# Patient Record
Sex: Female | Born: 1960 | Hispanic: No | Marital: Married | Smoking: Never smoker
Health system: Southern US, Community
[De-identification: ages and names within clinical notes are randomized; demographics above are authoritative.]

## PROBLEM LIST (undated history)

## (undated) DIAGNOSIS — K2970 Gastritis, unspecified, without bleeding: Secondary | ICD-10-CM

## (undated) DIAGNOSIS — Z85850 Personal history of malignant neoplasm of thyroid: Secondary | ICD-10-CM

## (undated) DIAGNOSIS — E785 Hyperlipidemia, unspecified: Secondary | ICD-10-CM

## (undated) DIAGNOSIS — T7840XA Allergy, unspecified, initial encounter: Secondary | ICD-10-CM

## (undated) DIAGNOSIS — E559 Vitamin D deficiency, unspecified: Secondary | ICD-10-CM

## (undated) DIAGNOSIS — C73 Malignant neoplasm of thyroid gland: Secondary | ICD-10-CM

## (undated) DIAGNOSIS — R109 Unspecified abdominal pain: Secondary | ICD-10-CM

## (undated) DIAGNOSIS — I639 Cerebral infarction, unspecified: Secondary | ICD-10-CM

## (undated) DIAGNOSIS — Z889 Allergy status to unspecified drugs, medicaments and biological substances status: Secondary | ICD-10-CM

## (undated) DIAGNOSIS — D352 Benign neoplasm of pituitary gland: Secondary | ICD-10-CM

## (undated) DIAGNOSIS — E039 Hypothyroidism, unspecified: Secondary | ICD-10-CM

## (undated) DIAGNOSIS — K760 Fatty (change of) liver, not elsewhere classified: Secondary | ICD-10-CM

## (undated) DIAGNOSIS — E041 Nontoxic single thyroid nodule: Secondary | ICD-10-CM

## (undated) DIAGNOSIS — K859 Acute pancreatitis without necrosis or infection, unspecified: Secondary | ICD-10-CM

## (undated) DIAGNOSIS — R7301 Impaired fasting glucose: Secondary | ICD-10-CM

## (undated) DIAGNOSIS — R002 Palpitations: Secondary | ICD-10-CM

## (undated) DIAGNOSIS — E079 Disorder of thyroid, unspecified: Secondary | ICD-10-CM

## (undated) DIAGNOSIS — E669 Obesity, unspecified: Secondary | ICD-10-CM

## (undated) DIAGNOSIS — Z6830 Body mass index (BMI) 30.0-30.9, adult: Secondary | ICD-10-CM

## (undated) DIAGNOSIS — Z789 Other specified health status: Secondary | ICD-10-CM

## (undated) DIAGNOSIS — K219 Gastro-esophageal reflux disease without esophagitis: Secondary | ICD-10-CM

## (undated) HISTORY — DX: Gastritis, unspecified, without bleeding: K29.70

## (undated) HISTORY — DX: Disorder of thyroid, unspecified: E07.9

## (undated) HISTORY — DX: Impaired fasting glucose: R73.01

## (undated) HISTORY — DX: Benign neoplasm of pituitary gland: D35.2

## (undated) HISTORY — DX: Obesity, unspecified: E66.9

## (undated) HISTORY — DX: Allergy, unspecified, initial encounter: T78.40XA

## (undated) HISTORY — PX: UPPER GASTROINTESTINAL ENDOSCOPY: SHX188

## (undated) HISTORY — PX: TRANSPHENOIDAL PITUITARY RESECTION: SHX2572

## (undated) HISTORY — DX: Fatty (change of) liver, not elsewhere classified: K76.0

## (undated) HISTORY — PX: COLONOSCOPY: SHX174

## (undated) HISTORY — PX: ESOPHAGOGASTRODUODENOSCOPY: SHX1529

## (undated) HISTORY — DX: Cerebral infarction, unspecified: I63.9

## (undated) HISTORY — DX: Acute pancreatitis without necrosis or infection, unspecified: K85.90

## (undated) HISTORY — PX: HAMMER TOE SURGERY: SHX385

## (undated) HISTORY — PX: POLYPECTOMY: SHX149

## (undated) HISTORY — DX: Unspecified abdominal pain: R10.9

## (undated) HISTORY — DX: Body mass index (BMI) 30.0-30.9, adult: Z68.30

## (undated) HISTORY — DX: Hyperlipidemia, unspecified: E78.5

## (undated) HISTORY — DX: Personal history of malignant neoplasm of thyroid: Z85.850

## (undated) HISTORY — DX: Palpitations: R00.2

## (undated) HISTORY — DX: Vitamin D deficiency, unspecified: E55.9

## (undated) HISTORY — DX: Nontoxic single thyroid nodule: E04.1

## (undated) HISTORY — DX: Malignant neoplasm of thyroid gland: C73

---

## 1984-10-20 HISTORY — PX: APPENDECTOMY: SHX54

## 2007-10-21 HISTORY — PX: THYROIDECTOMY: SHX17

## 2008-08-14 ENCOUNTER — Encounter: Payer: Self-pay | Admitting: Endocrinology

## 2008-08-21 ENCOUNTER — Encounter: Payer: Self-pay | Admitting: Endocrinology

## 2008-08-22 ENCOUNTER — Encounter: Payer: Self-pay | Admitting: Endocrinology

## 2008-09-21 ENCOUNTER — Encounter: Payer: Self-pay | Admitting: Endocrinology

## 2008-09-25 ENCOUNTER — Encounter: Payer: Self-pay | Admitting: Endocrinology

## 2008-12-19 ENCOUNTER — Encounter: Payer: Self-pay | Admitting: Endocrinology

## 2009-01-17 DIAGNOSIS — E785 Hyperlipidemia, unspecified: Secondary | ICD-10-CM | POA: Insufficient documentation

## 2009-01-31 ENCOUNTER — Ambulatory Visit: Payer: Self-pay | Admitting: Endocrinology

## 2009-01-31 DIAGNOSIS — K219 Gastro-esophageal reflux disease without esophagitis: Secondary | ICD-10-CM | POA: Insufficient documentation

## 2009-01-31 DIAGNOSIS — C73 Malignant neoplasm of thyroid gland: Secondary | ICD-10-CM | POA: Insufficient documentation

## 2009-01-31 DIAGNOSIS — E890 Postprocedural hypothyroidism: Secondary | ICD-10-CM | POA: Insufficient documentation

## 2009-12-21 ENCOUNTER — Encounter: Admission: RE | Admit: 2009-12-21 | Discharge: 2009-12-21 | Payer: Self-pay | Admitting: Gynecology

## 2009-12-21 IMAGING — MG MM DIGITAL DIAGNOSTIC BILAT
4 series · 4 of 4 positions shown · non-contrast
Comparison: Previous bilateral screening mammogram dated
[DATE] and left breast ultrasound obtained at [REDACTED] on [DATE].

CLINICAL DATA: Follow-up left breast cyst.  Intermittent pain
across the inferior aspect of the left breast, not currently.

DIGITAL DIAGNOSTIC BILATERAL MAMMOGRAM WITH CAD

[R CC]
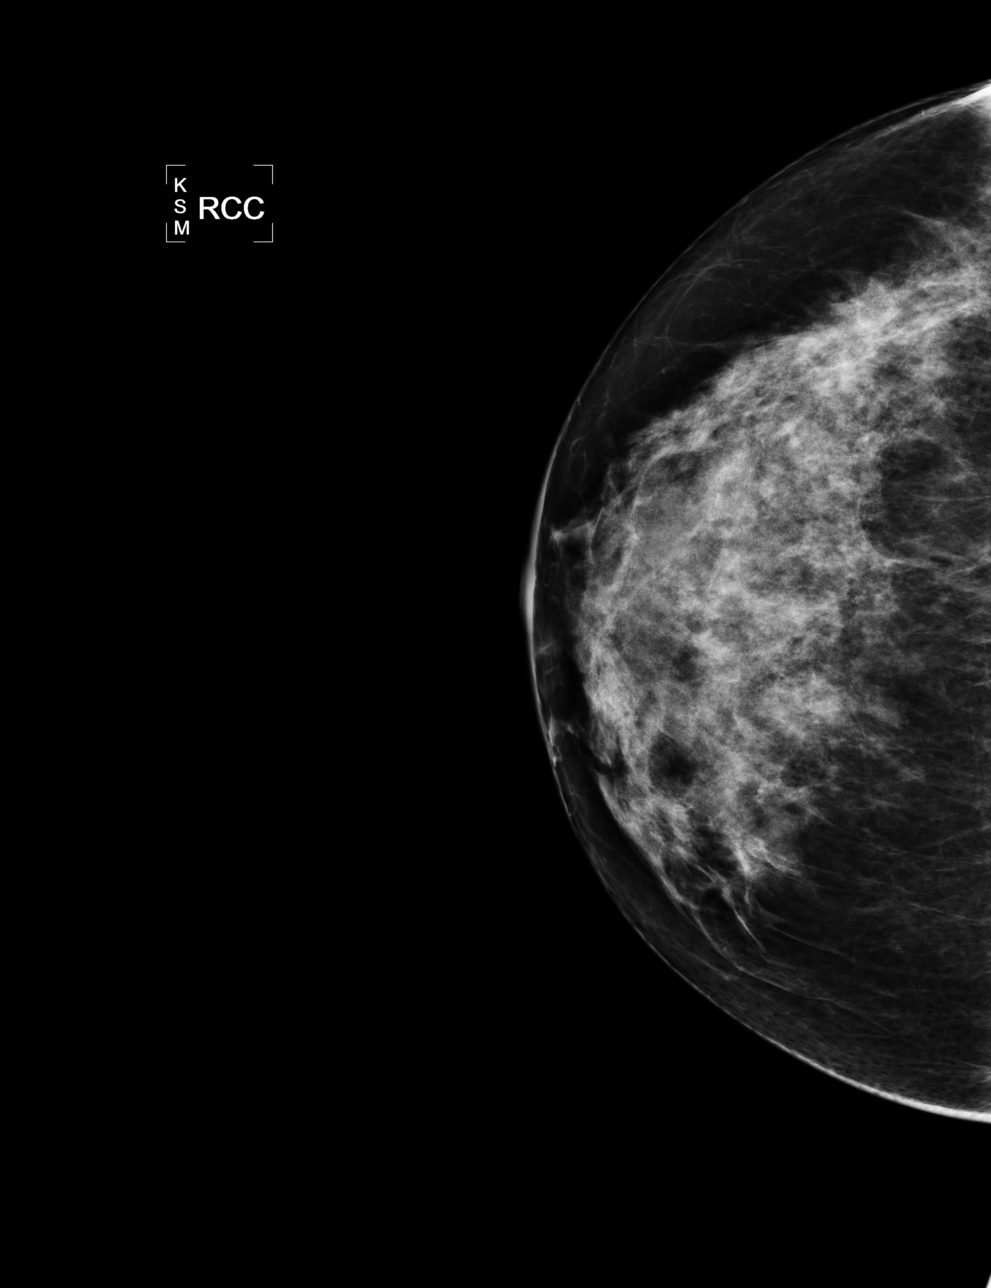

[L CC]
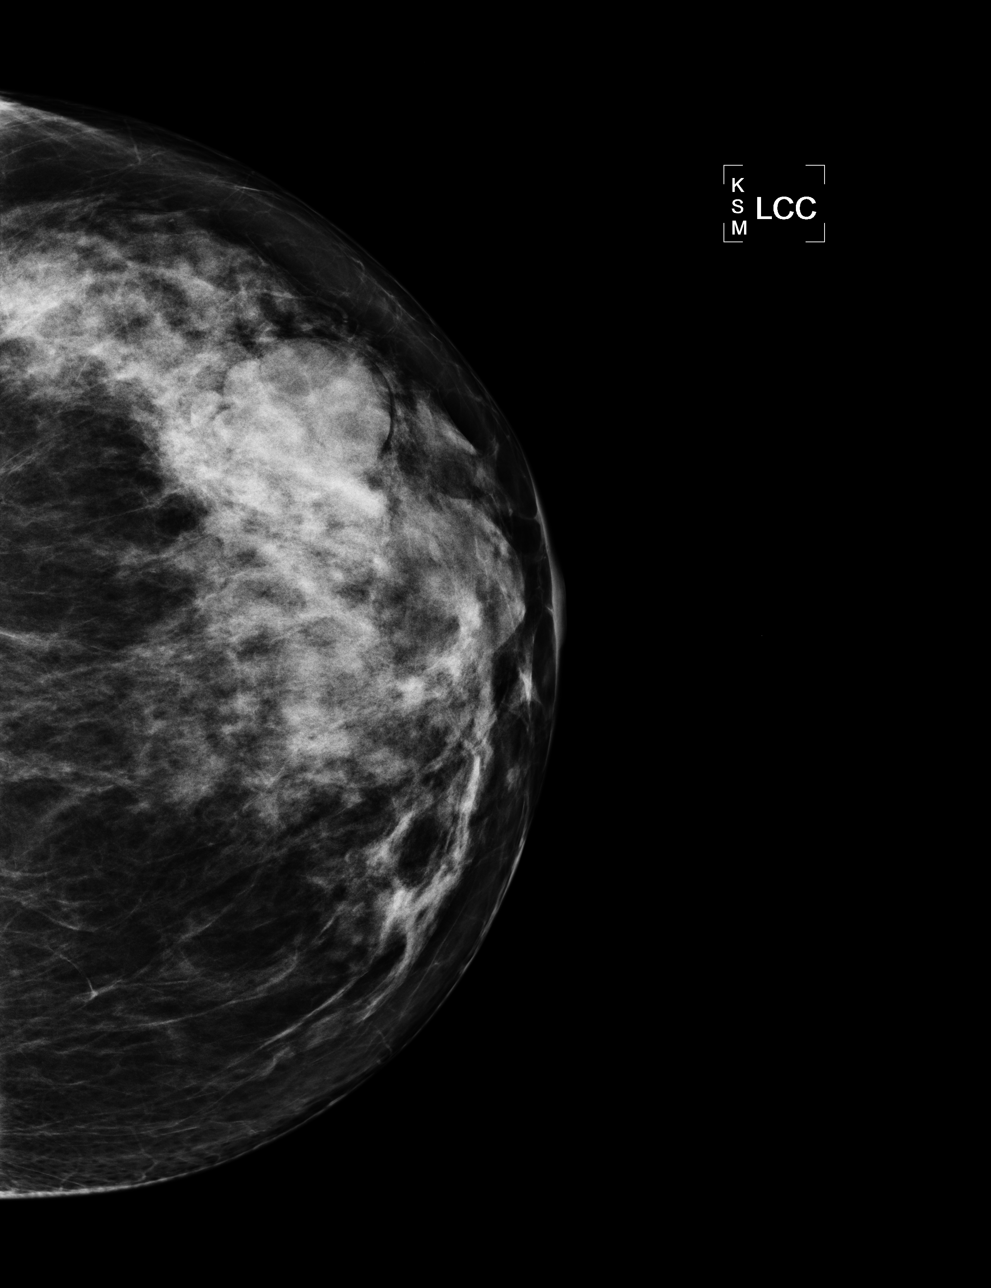

[L MLO]
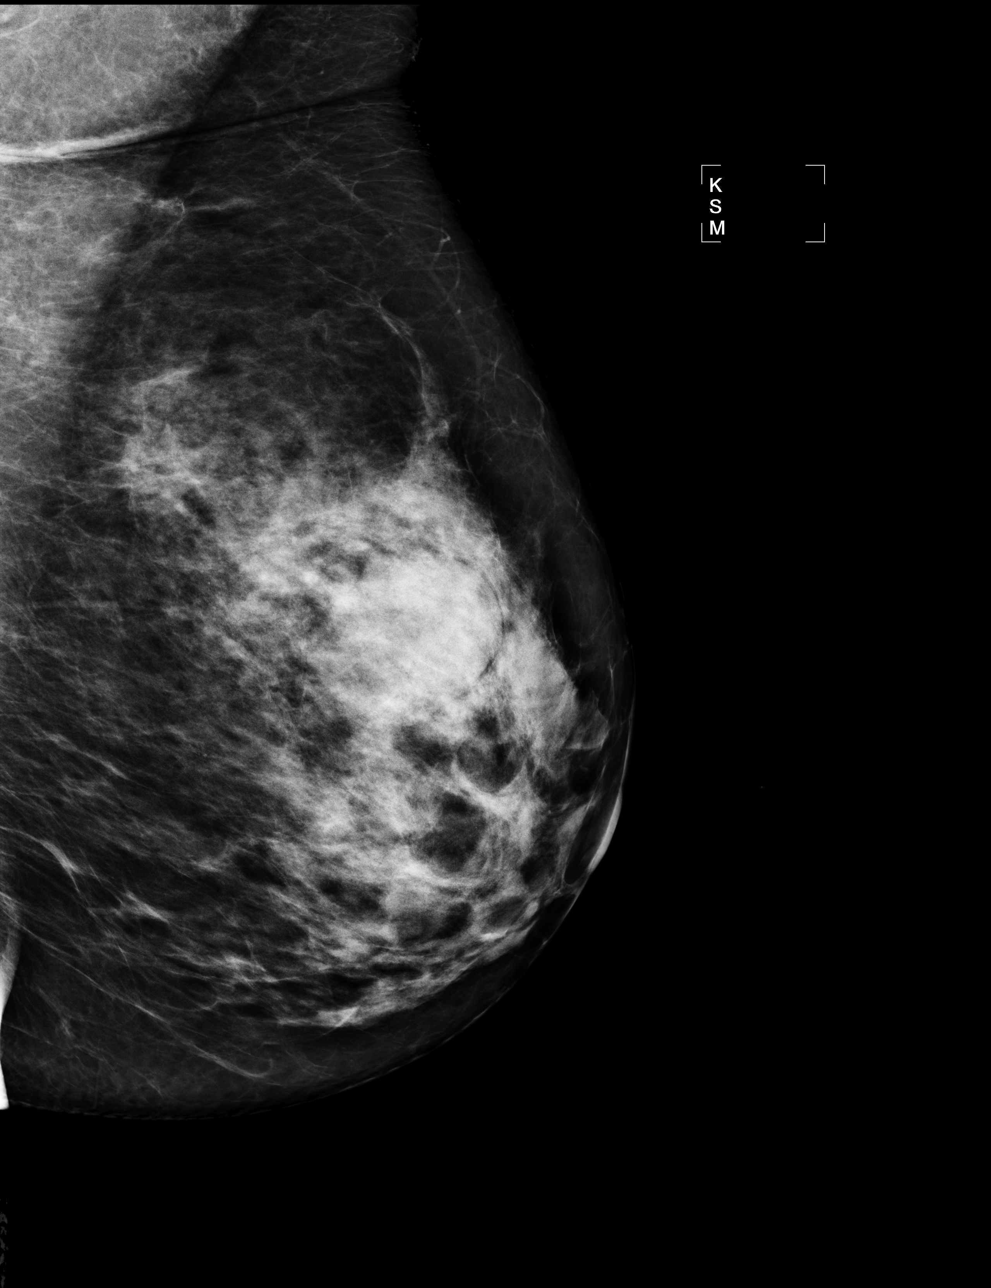

[R MLO]
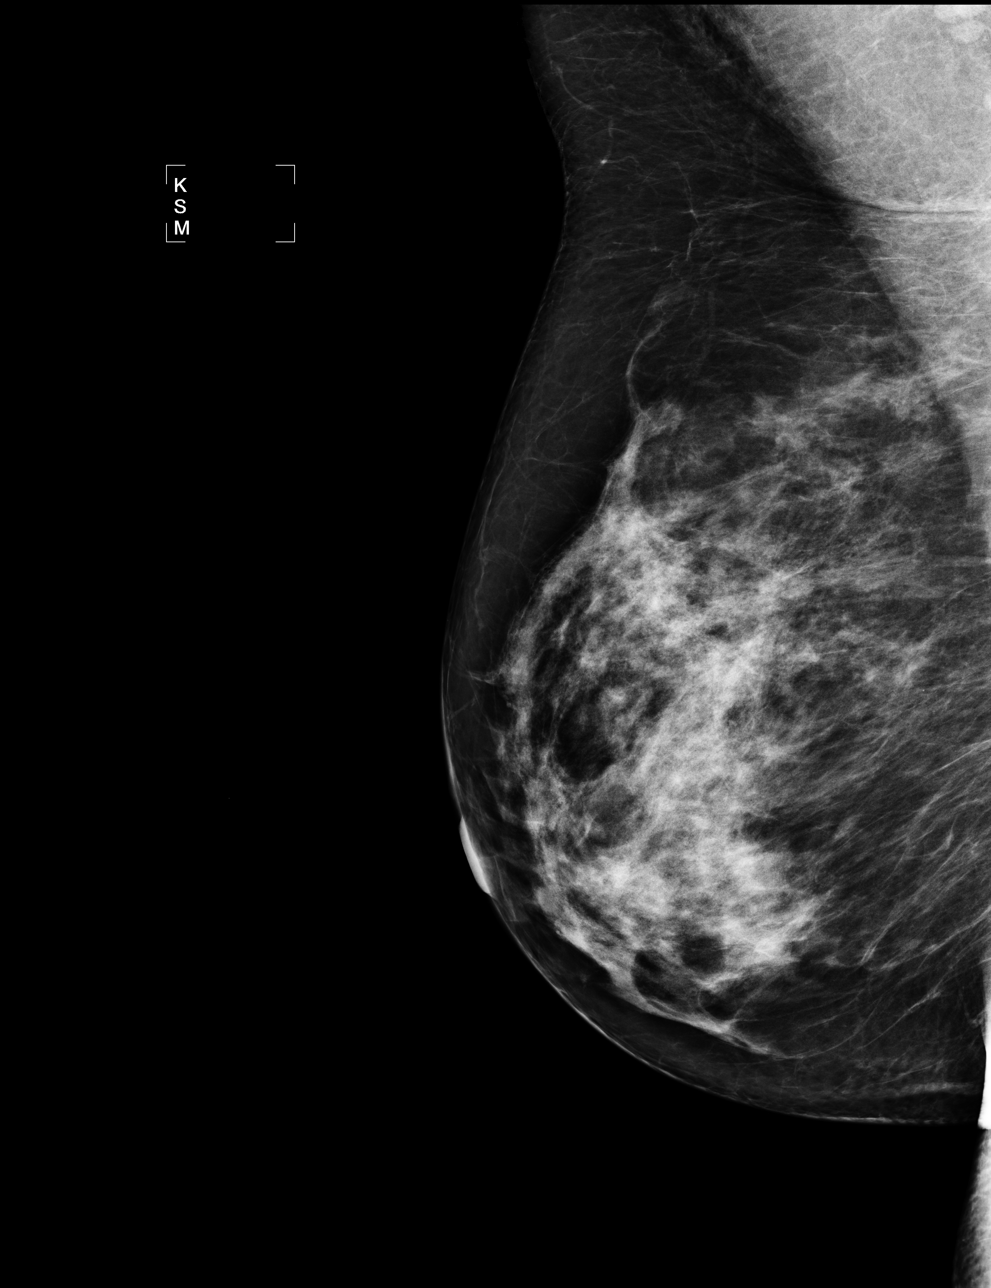

[4 of 4 positions shown; findings below may reference images not displayed]

FINDINGS: Stable heterogeneously dense parenchyma in both breasts.
The previously demonstrated 2.9 cm upper outer left breast cyst is
slightly larger, currently measuring 3.1 cm in maximum diameter.
No new findings suspicious for malignancy in either breast.
Mammographic images were processed with CAD.
IMPRESSION: Mild interval increase in size of the previously evaluated left
breast cyst.  No evidence of malignancy.  Screening mammography is
recommended in 1 year.

BI-RADS CATEGORY 2:  Benign finding(s).

## 2011-03-25 ENCOUNTER — Other Ambulatory Visit: Payer: Self-pay | Admitting: Gynecology

## 2011-03-25 DIAGNOSIS — N6310 Unspecified lump in the right breast, unspecified quadrant: Secondary | ICD-10-CM

## 2011-03-25 DIAGNOSIS — N6320 Unspecified lump in the left breast, unspecified quadrant: Secondary | ICD-10-CM

## 2011-03-31 ENCOUNTER — Ambulatory Visit
Admission: RE | Admit: 2011-03-31 | Discharge: 2011-03-31 | Disposition: A | Discharge status: Home / Self Care | Payer: BC Managed Care – PPO | Source: Ambulatory Visit | Attending: Gynecology | Admitting: Gynecology

## 2011-03-31 ENCOUNTER — Other Ambulatory Visit: Payer: Self-pay | Admitting: Gynecology

## 2011-03-31 DIAGNOSIS — N6310 Unspecified lump in the right breast, unspecified quadrant: Secondary | ICD-10-CM

## 2011-03-31 DIAGNOSIS — N6320 Unspecified lump in the left breast, unspecified quadrant: Secondary | ICD-10-CM

## 2011-03-31 IMAGING — US US BREAST R
1 series · 7 of 7 positions shown · non-contrast
Comparison: [DATE] and [DATE].

CLINICAL DATA: The patient complains of episodic bilateral breast
pain.  She does have a history of fibrocystic breast disease with
previous left breast cyst aspiration performed in [REDACTED] (the
patient's home).

DIGITAL DIAGNOSTIC BILATERAL MAMMOGRAM WITH CAD AND RIGHT BREAST
ULTRASOUND:

[Series 1: us breast right · 7 of 7 slices shown]
[im 1/7]
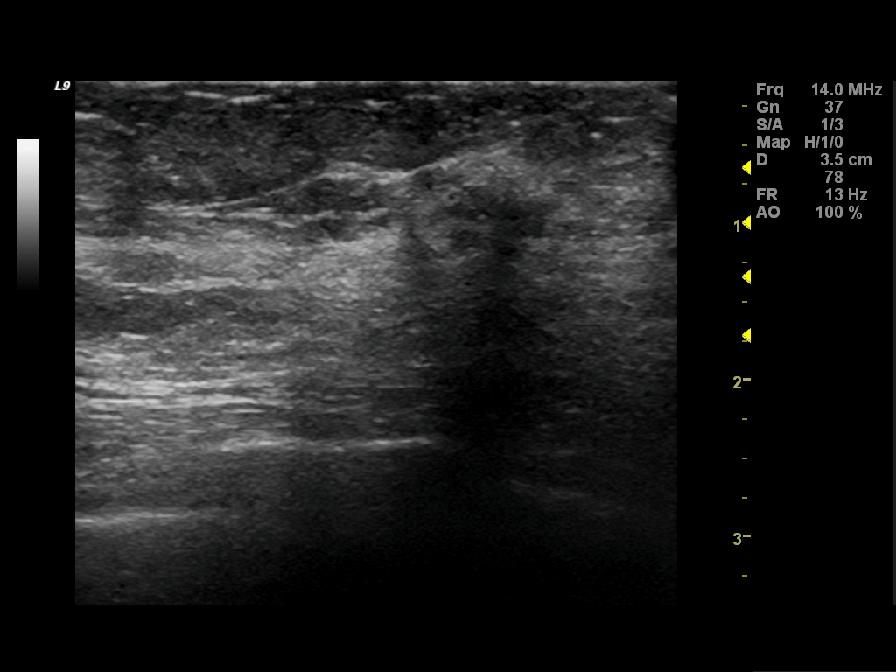
[im 2/7]
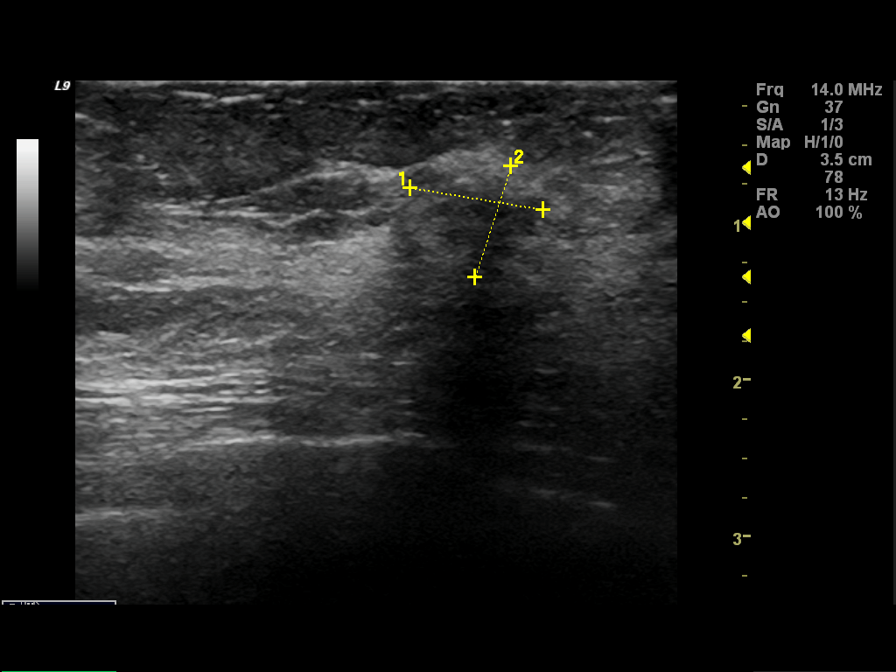
[im 3/7]
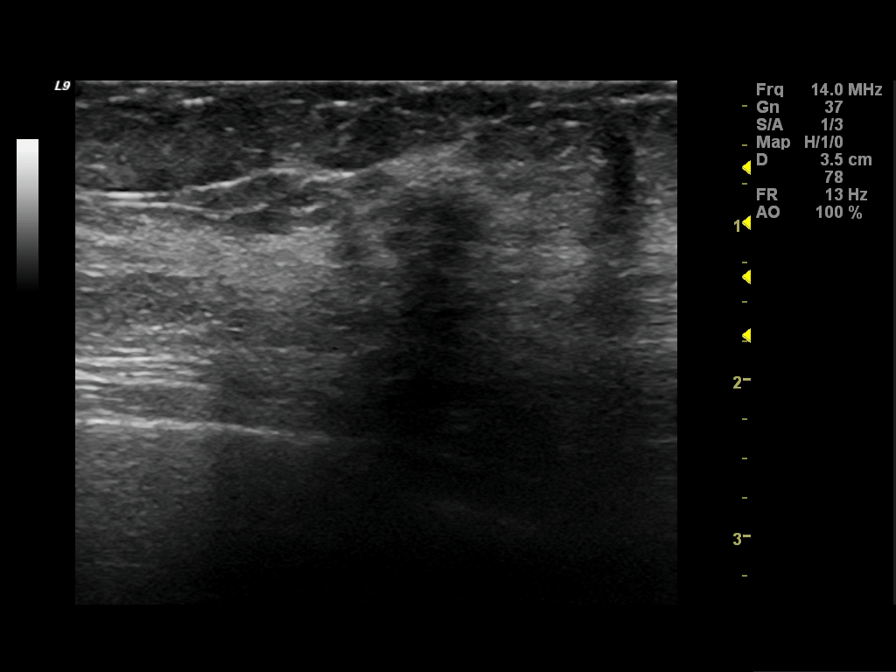
[im 4/7]
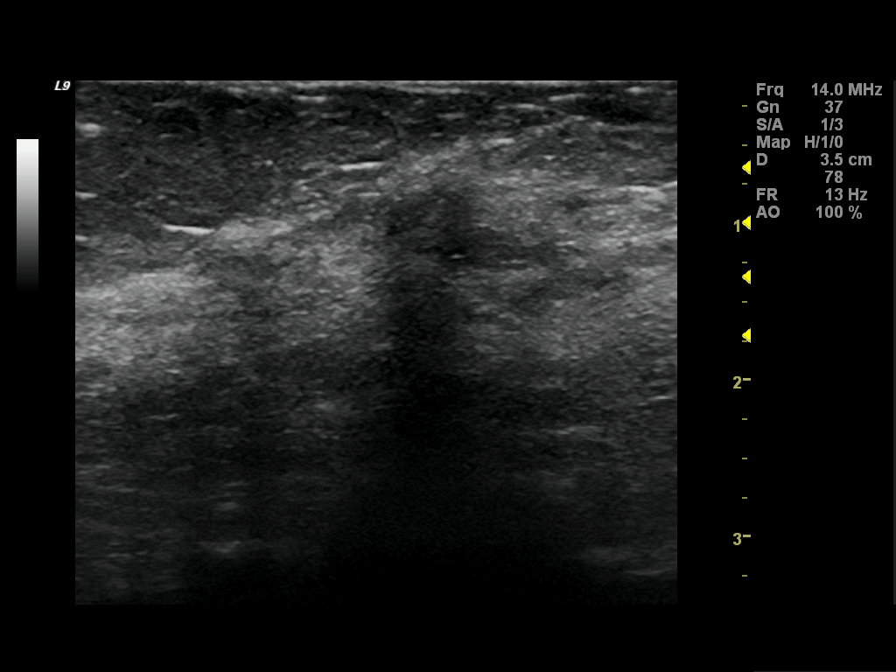
[im 5/7]
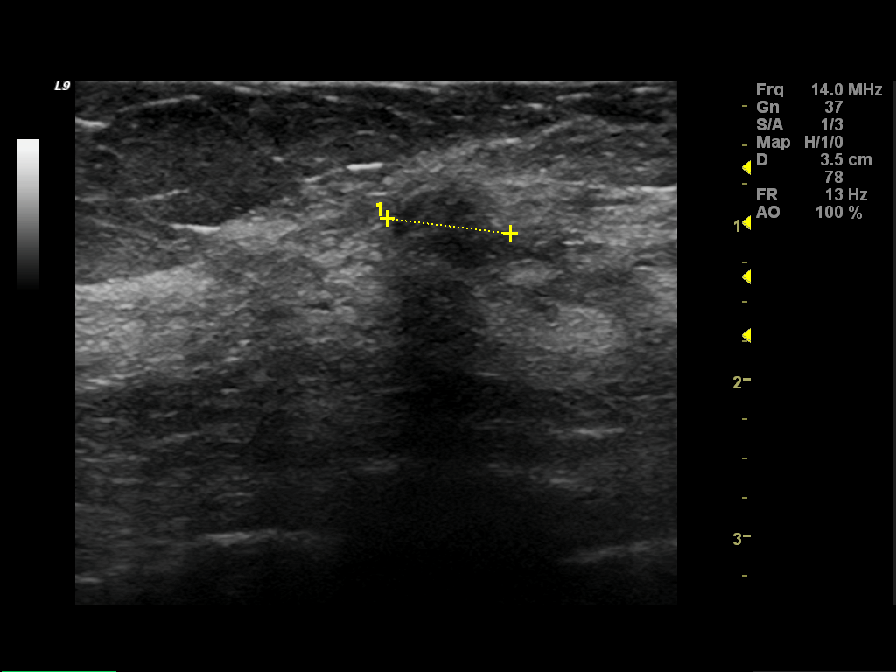
[im 6/7]
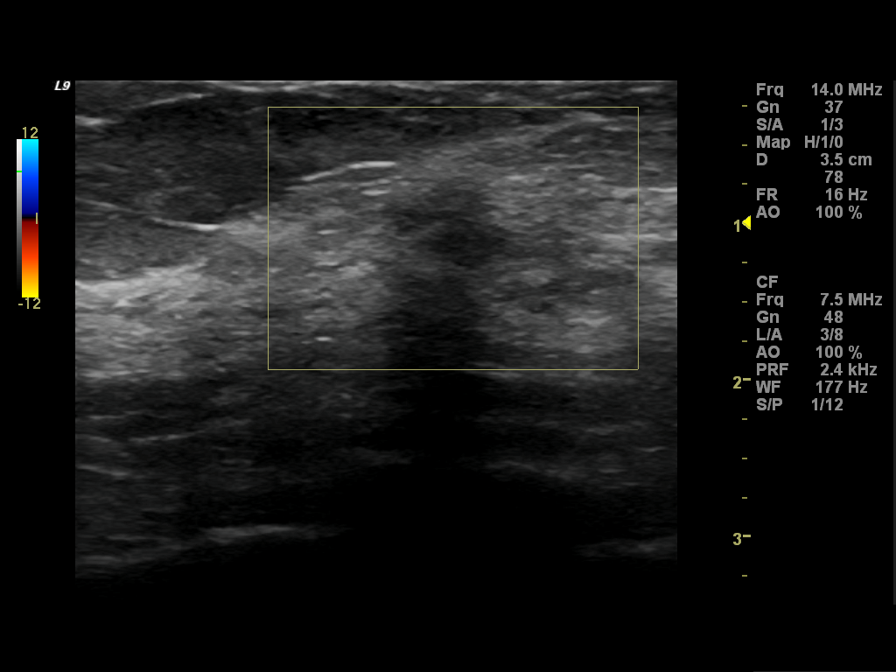
[im 7/7]
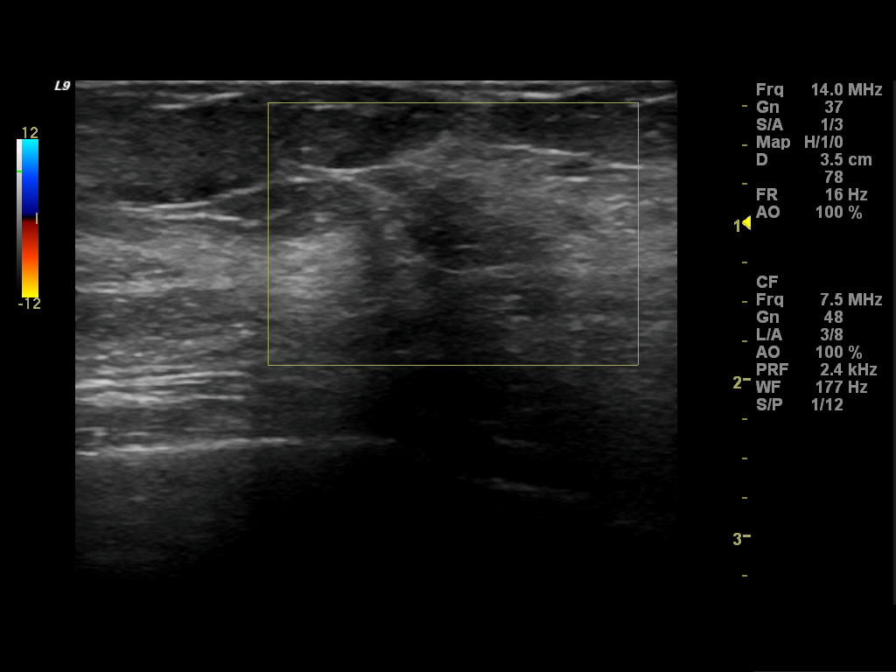

[7 of 7 positions shown; findings below may reference images not displayed]

FINDINGS: There is a heterogeneously dense breast parenchymal
pattern.  There is an area of subtle distortion located laterally
within the right breast at approximately the 9-10 o'clock position.
The previously seen cyst located laterally within the left breast
has resolved.  There are no worrisome calcifications.
Mammographic images were processed with CAD.

On physical exam, there is no discrete palpable abnormality within
the lateral right breast.

Ultrasound is performed, showing a subtle irregular hypoechoic area
with shadowing located within the right breast at the 9 o'clock
position approximately 6-7 cm from the nipple.  This is in the
region of the subtle distortion noted on mammography.  This is
difficult to measure with certainty by ultrasound but measures
approximately 9 mm in diameter.

I have discussed tissue sampling via ultrasound-guided core biopsy
or possibly stereotactic core biopsy if this is not adequately
visualized at the time the ultrasound-guided procedure.  Also, she
understands that clip placement is necessary and if this ultrasound
finding does not correspond to the mammographic finding,
stereotactic core biopsy or surgical excisional biopsy may be
needed.
IMPRESSION: Subtle area of distortion located laterally within the right
breast.  Tissue sampling is recommended.  Ultrasound-guided core
biopsy will be scheduled.

BI-RADS CATEGORY 4:  Suspicious abnormality - biopsy should be
considered.

## 2011-04-01 IMAGING — MG MM DIGITAL DIAGNOSTIC BILAT {BCG}
7 series · 7 of 7 positions shown · non-contrast
Comparison: [DATE] and [DATE].

CLINICAL DATA: The patient complains of episodic bilateral breast
pain.  She does have a history of fibrocystic breast disease with
previous left breast cyst aspiration performed in [REDACTED] (the
patient's home).

DIGITAL DIAGNOSTIC BILATERAL MAMMOGRAM WITH CAD AND RIGHT BREAST
ULTRASOUND:

[R CC (1 of 2)]
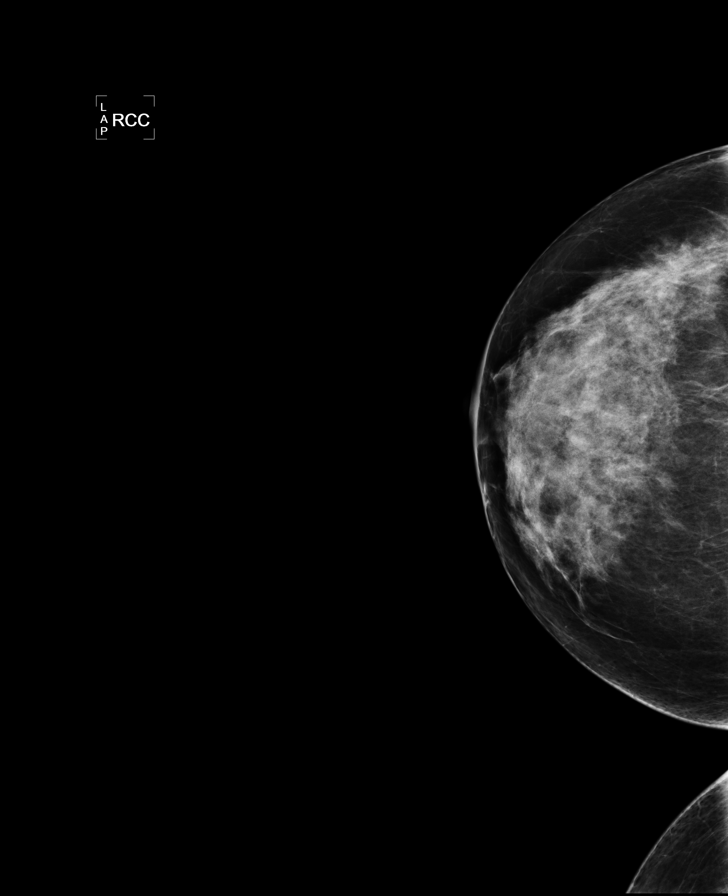

[L CC]
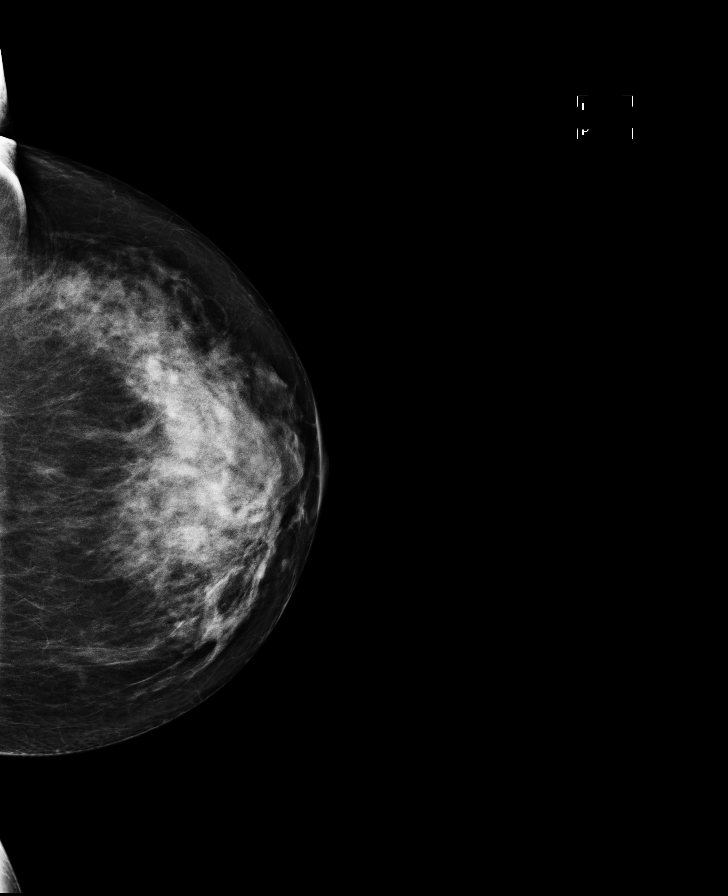

[L MLO]
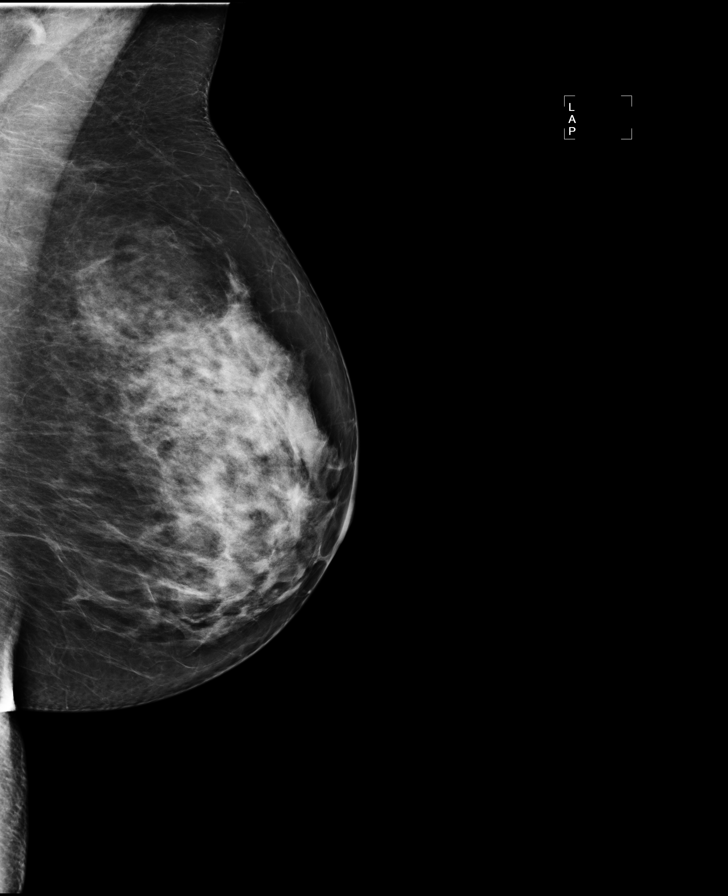

[R MLO (1 of 2)]
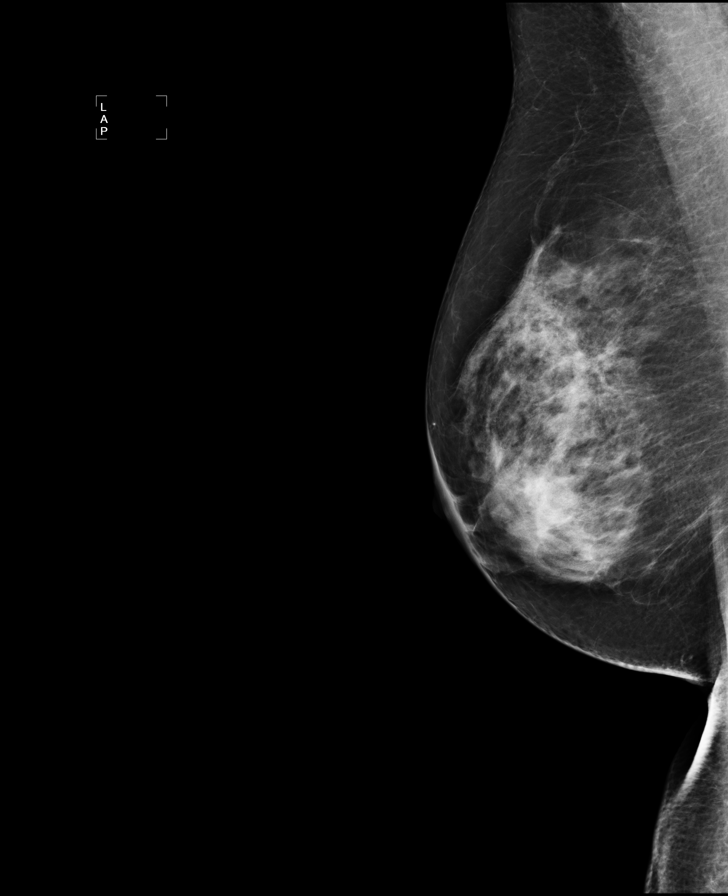

[R CC (2 of 2)]
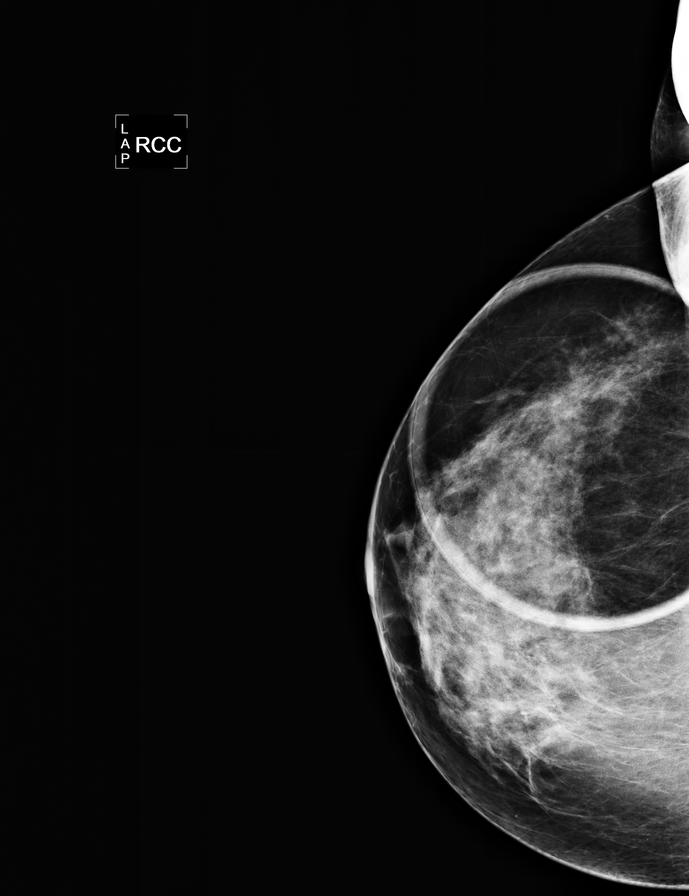

[R MLO (2 of 2)]
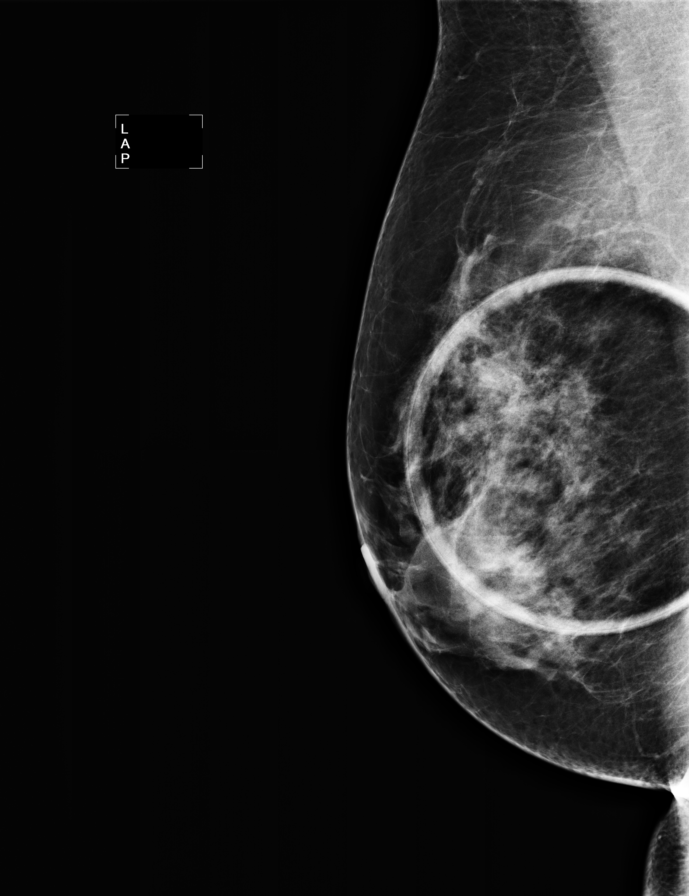

[R ML]
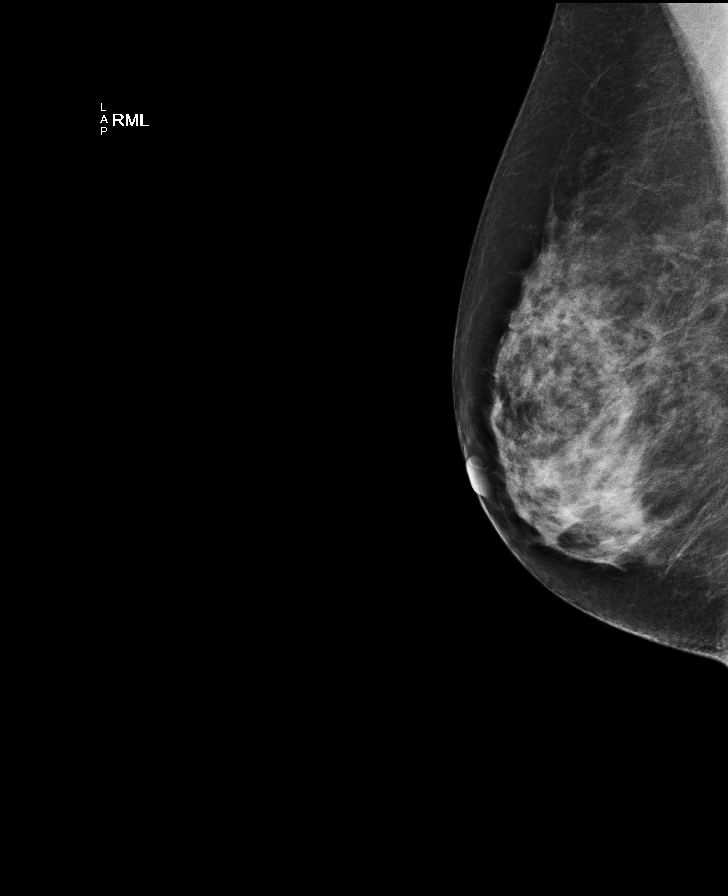

[7 of 7 positions shown; findings below may reference images not displayed]

FINDINGS: There is a heterogeneously dense breast parenchymal
pattern.  There is an area of subtle distortion located laterally
within the right breast at approximately the 9-10 o'clock position.
The previously seen cyst located laterally within the left breast
has resolved.  There are no worrisome calcifications.
Mammographic images were processed with CAD.

On physical exam, there is no discrete palpable abnormality within
the lateral right breast.

Ultrasound is performed, showing a subtle irregular hypoechoic area
with shadowing located within the right breast at the 9 o'clock
position approximately 6-7 cm from the nipple.  This is in the
region of the subtle distortion noted on mammography.  This is
difficult to measure with certainty by ultrasound but measures
approximately 9 mm in diameter.

I have discussed tissue sampling via ultrasound-guided core biopsy
or possibly stereotactic core biopsy if this is not adequately
visualized at the time the ultrasound-guided procedure.  Also, she
understands that clip placement is necessary and if this ultrasound
finding does not correspond to the mammographic finding,
stereotactic core biopsy or surgical excisional biopsy may be
needed.
IMPRESSION: Subtle area of distortion located laterally within the right
breast.  Tissue sampling is recommended.  Ultrasound-guided core
biopsy will be scheduled.

BI-RADS CATEGORY 4:  Suspicious abnormality - biopsy should be
considered.

## 2011-04-10 ENCOUNTER — Inpatient Hospital Stay: Admission: RE | Admit: 2011-04-10 | Payer: BC Managed Care – PPO | Source: Ambulatory Visit

## 2011-04-10 ENCOUNTER — Other Ambulatory Visit: Payer: BC Managed Care – PPO

## 2011-05-07 ENCOUNTER — Other Ambulatory Visit: Payer: BC Managed Care – PPO

## 2011-05-08 ENCOUNTER — Other Ambulatory Visit: Payer: Self-pay | Admitting: Radiology

## 2011-05-08 ENCOUNTER — Ambulatory Visit
Admission: RE | Admit: 2011-05-08 | Discharge: 2011-05-08 | Disposition: A | Discharge status: Home / Self Care | Payer: BC Managed Care – PPO | Source: Ambulatory Visit | Attending: Gynecology | Admitting: Gynecology

## 2011-05-08 ENCOUNTER — Other Ambulatory Visit: Payer: Self-pay | Admitting: Gynecology

## 2011-05-08 DIAGNOSIS — N6310 Unspecified lump in the right breast, unspecified quadrant: Secondary | ICD-10-CM

## 2011-05-08 HISTORY — PX: BREAST BIOPSY: SHX20

## 2011-05-08 IMAGING — US US CORE
1 series · 12 of 16 positions shown · non-contrast
Comparison: none

***ADDENDUM*** CREATED: [DATE] [DATE]

Due to an administrative error, the previous report belongs to
another patient. Please disregard it and refer to the addendum for
the original report.
CLINICAL DATA: Right breast distortion/mass.
CLINICAL DATA: The patient has been referred for possible
ultrasound-guided core biopsy of a right breast mass found on
screening mammography. There is no family history of breast cancer.

[Series 1: us core · 12 of 18 slices shown]
[im 1/18]
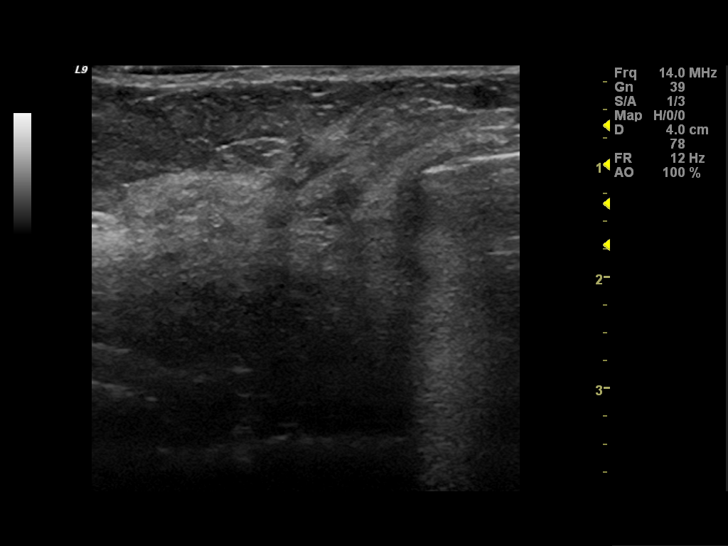
[im 3/18]
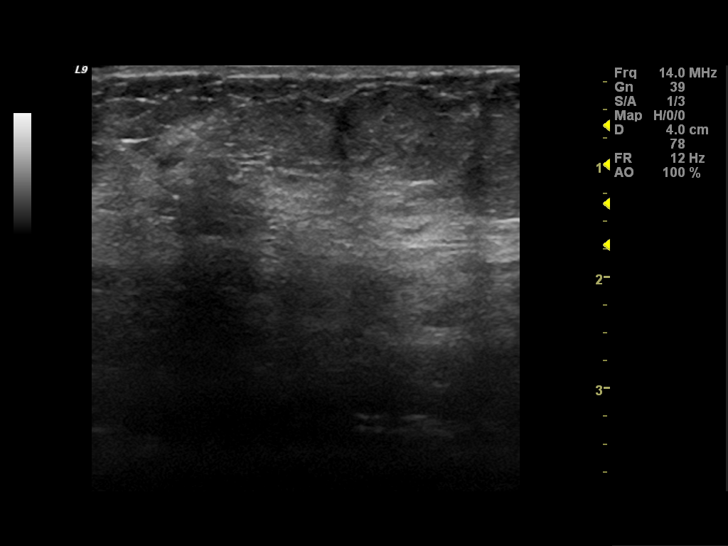
[im 4/18]
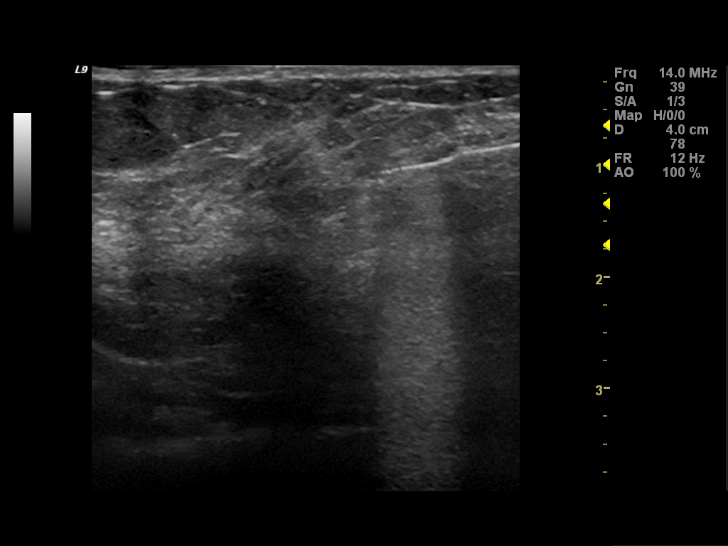
[im 5/18]
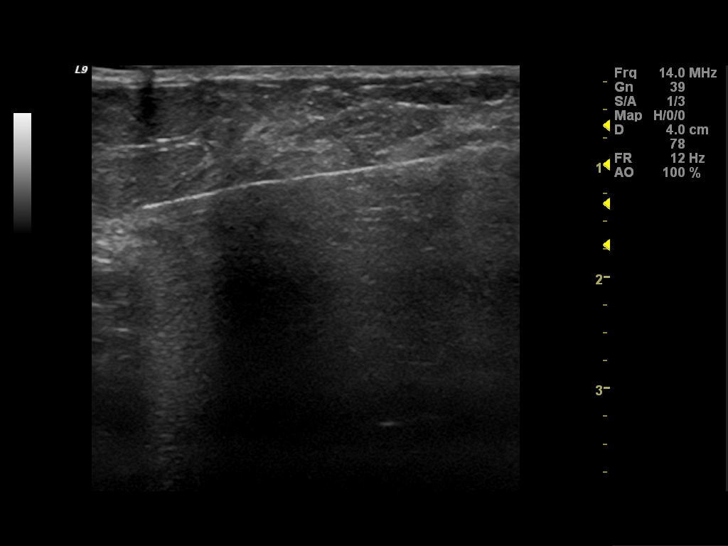
[im 7/18]
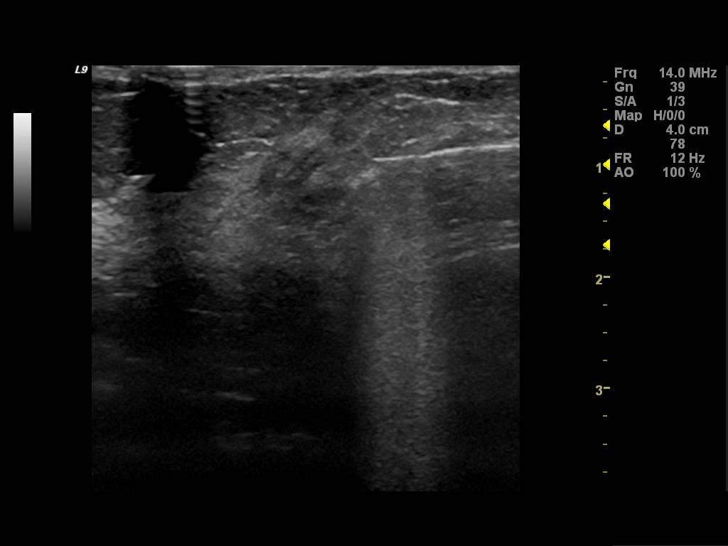
[im 8/18]
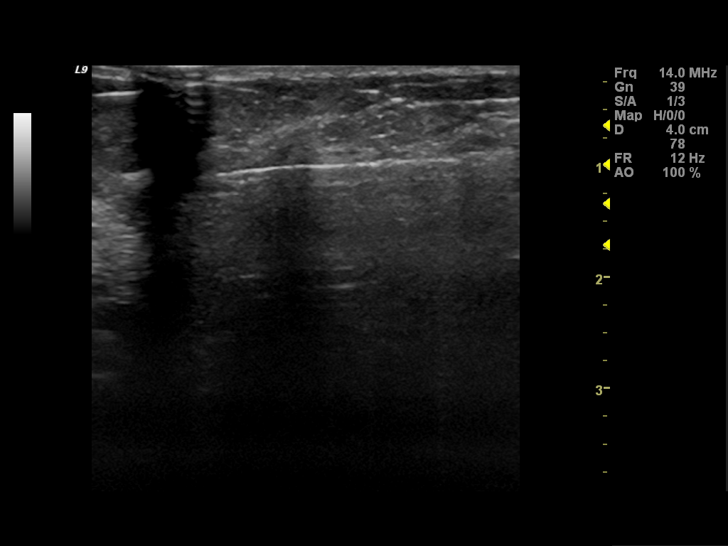
[im 10/18]
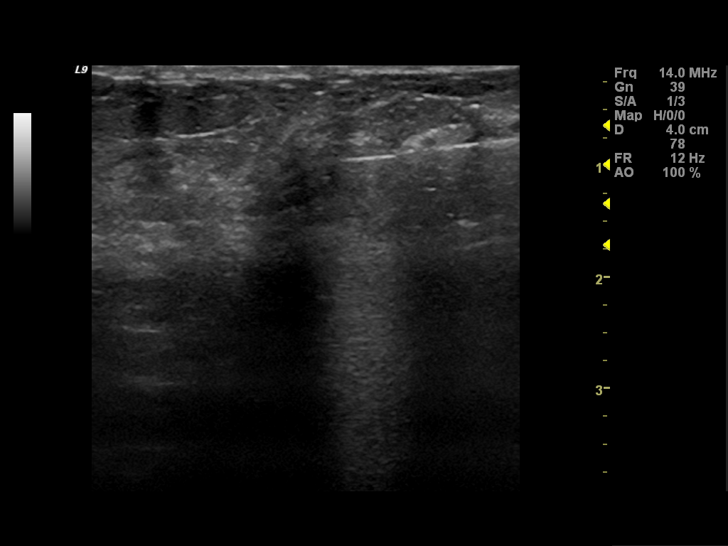
[im 12/18]
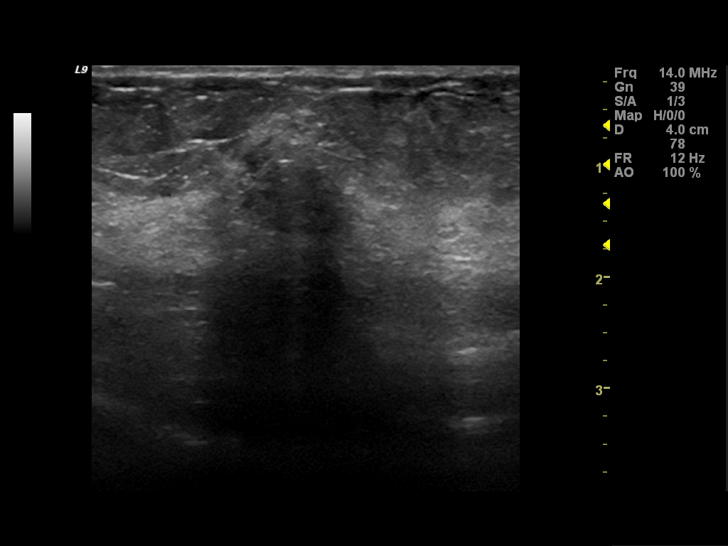
[im 13/18]
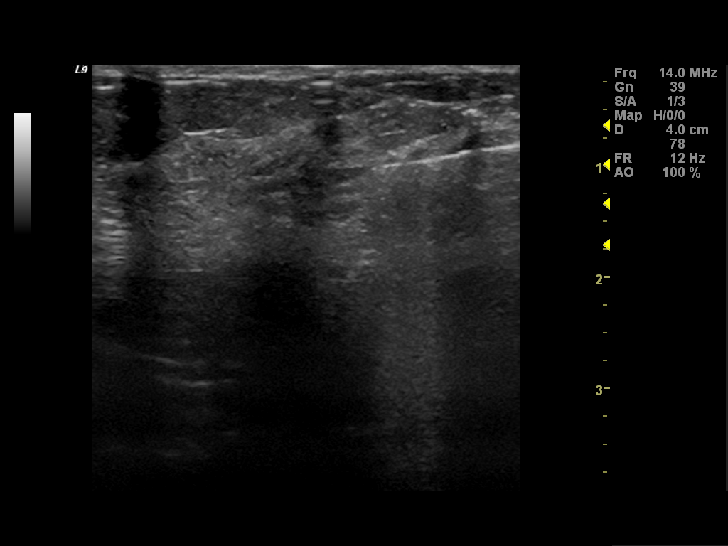
[im 14/18]
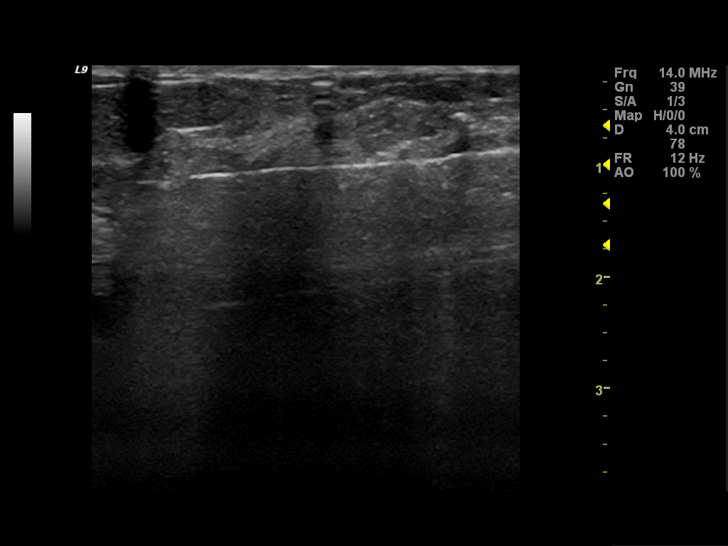
[im 16/18]
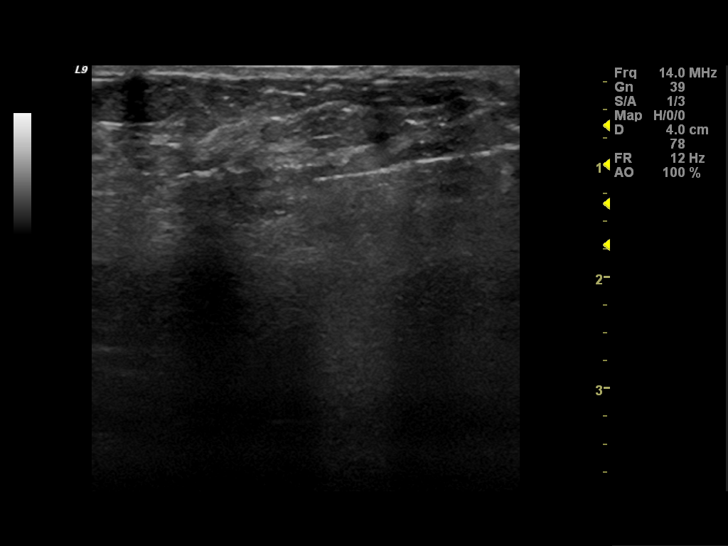
[im 18/18]
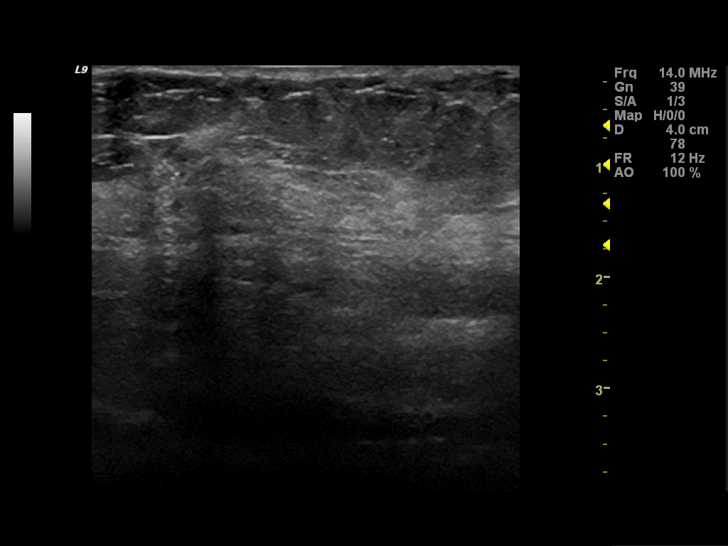

[12 of 16 positions shown; findings below may reference images not displayed]

ULTRASOUND GUIDED CORE BIOPSY OF THE RIGHT BREAST

I met with the patient, and we discussed the procedure of
ultrasound-guided biopsy, including risks, benefits, and
alternatives.  Specifically, we discussed the risks of infection,
bleeding, tissue injury, clip migration, and inadequate sampling.
Informed, written consent was given.

Using sterile technique,  2% lidocaine, ultrasound guidance, and a
14 gauge automated biopsy device, biopsy was performed of
hypoechoic mass with distortion located within the right breast at
the 9 o'clock position 7 cm from the nipple.  At the conclusion of
the procedure, a wing shaped tissue marker clip was deployed into
the biopsy cavity.  Follow-up 2-view mammogram was performed and
dictated separately.
IMPRESSION: Ultrasound-guided biopsy of the hypoechoic mass with distortion
located within the right breast at the 9 o'clock position as
discussed above.  No apparent complications.

Addended by:  MOOLMAN, M.D. on [DATE] [DATE].

***END ADDENDUM*** SIGNED BY: MOOLMAN, M.D.
Outside mammograms from [HOSPITAL], [facility]OL[facility] are available for
evaluation.  These are dated [DATE] and [DATE] as well as
previous mammograms dated [DATE].  There are prior ultrasound
reports available dated [DATE] and [DATE].  However, the
ultrasound images are not available for evaluation.

Review of these images demonstrates an oval circumscribed mass to
be located far posteriorly and medially within the right breast at
approximately the 2 o'clock position.  This likely contains fat and
has a probably benign appearance - most likely representing a
fibroadenolipoma.  This was not visible on the prior mammogram
dated [DATE].  However, the posterior medial portion of the
right breast was not as fully visualized on this prior study and
the lesion may not been included on that examination.

Repeat ultrasound evaluation performed at our [REDACTED]
demonstrates an oval, circumscribed, heterogeneous solid mass to be
located at the 2 o'clock position within the posterior one third of
the right breast 14 cm from nipple.  This has a heterogeneous
ultrasound appearance and is oriented parallel to the chest wall.
By ultrasound this measures 1.6 x 1.5 x 0.6 cm in size.  When
correlating the mammographic findings with the ultrasound findings
this may represent a breast hamartoma or possibly a fibroadenoma.
I have discussed the findings with the patient and discussed
options of 6-month follow-up right breast diagnostic mammogram with
ultrasound versus tissue sampling via ultrasound-guided core
biopsy.  The patient prefers tissue sampling at this time.

ULTRASOUND GUIDED CORE BIOPSY OF THE RIGHT BREAST

I met with the patient, and we discussed the procedure of
ultrasound-guided biopsy, including risks, benefits, and
alternatives.  Specifically, we discussed the risks of infection,
bleeding, tissue injury, clip migration, and inadequate sampling.
Informed, written consent was given.

Using sterile technique,  2% lidocaine, ultrasound guidance, and a
14 gauge automated biopsy device, biopsy was performed of the mass
located within the right breast at the 2 o'clock position.  At the
conclusion of the procedure, a wing shaped tissue marker clip was
deployed into the biopsy cavity.  Follow-up 2-view mammogram was
performed and dictated separately.
IMPRESSION: Ultrasound-guided biopsy of the mass located within the right
breast at 2 o'clock position as discussed above.  No apparent
complications.

## 2011-05-09 ENCOUNTER — Other Ambulatory Visit: Payer: Self-pay | Admitting: Gynecology

## 2011-05-09 ENCOUNTER — Ambulatory Visit
Admission: RE | Admit: 2011-05-09 | Discharge: 2011-05-09 | Disposition: A | Discharge status: Home / Self Care | Payer: BC Managed Care – PPO | Source: Ambulatory Visit | Attending: Gynecology | Admitting: Gynecology

## 2011-05-09 DIAGNOSIS — R928 Other abnormal and inconclusive findings on diagnostic imaging of breast: Secondary | ICD-10-CM

## 2011-12-01 ENCOUNTER — Other Ambulatory Visit: Payer: Self-pay | Admitting: Gynecology

## 2011-12-01 DIAGNOSIS — N63 Unspecified lump in unspecified breast: Secondary | ICD-10-CM

## 2011-12-26 ENCOUNTER — Other Ambulatory Visit: Payer: Self-pay | Admitting: Gynecology

## 2011-12-26 ENCOUNTER — Ambulatory Visit
Admission: RE | Admit: 2011-12-26 | Discharge: 2011-12-26 | Disposition: A | Discharge status: Home / Self Care | Payer: BC Managed Care – PPO | Source: Ambulatory Visit | Attending: Gynecology | Admitting: Gynecology

## 2011-12-26 DIAGNOSIS — N63 Unspecified lump in unspecified breast: Secondary | ICD-10-CM

## 2011-12-26 IMAGING — US US BREAST*R*
1 series · 4 of 4 positions shown · non-contrast
Comparison: [DATE], [DATE], [DATE].  [DATE] from
[REDACTED].
COMPARISON: [DATE], [DATE], [DATE].  [DATE] from
[REDACTED].

<!--  IDXRADR:ADDEND:BEGIN -->
Addendum Begins
<!--  IDXRADR:ADDEND:INNER_BEGIN -->
***ADDENDUM*** CREATED: [DATE] [DATE]

I have reviewed the right breast diagnostic mammogram and
ultrasound dated [DATE].  I do recommend surgical excision of
the area of distortion located laterally within the right breast at
approximately the 9 o'clock position.  The clip placed during
ultrasound guided core biopsy is located 14 mm inferior to the area
of distortion on today's right MLO view.  I have discussed the
findings with the patient and arranged appointment with Dr. PAKIKO
for [DATE] for consultation for possible surgical excision of
this area.
BI-RADS CATEGORY 4:  Suspicious abnormality - biopsy should be
considered.
Addended by:  PAKIKO, M.D. on [DATE] [DATE].
***END ADDENDUM*** SIGNED BY: PAKIKO, M.D.
CLINICAL DATA: The patient returns for short interval follow-up of
the right breast after ultrasound-guided core needle biopsy of the
9 o'clock position of the right breast demonstrated stromal
fibrosis on [DATE].
DIGITAL DIAGNOSTIC RIGHT MAMMOGRAM WITH CAD AND RIGHT BREAST
ULTRASOUND:

[Series 1: us breast*right* · 4 of 4 slices shown]
[im 1/4]
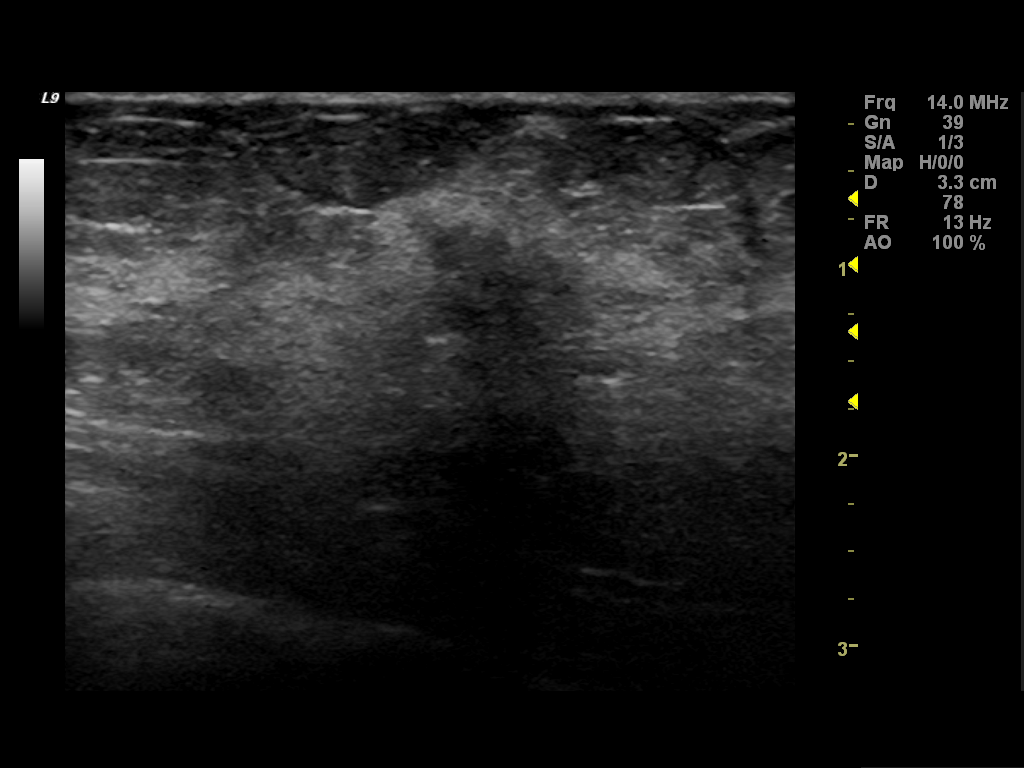
[im 2/4]
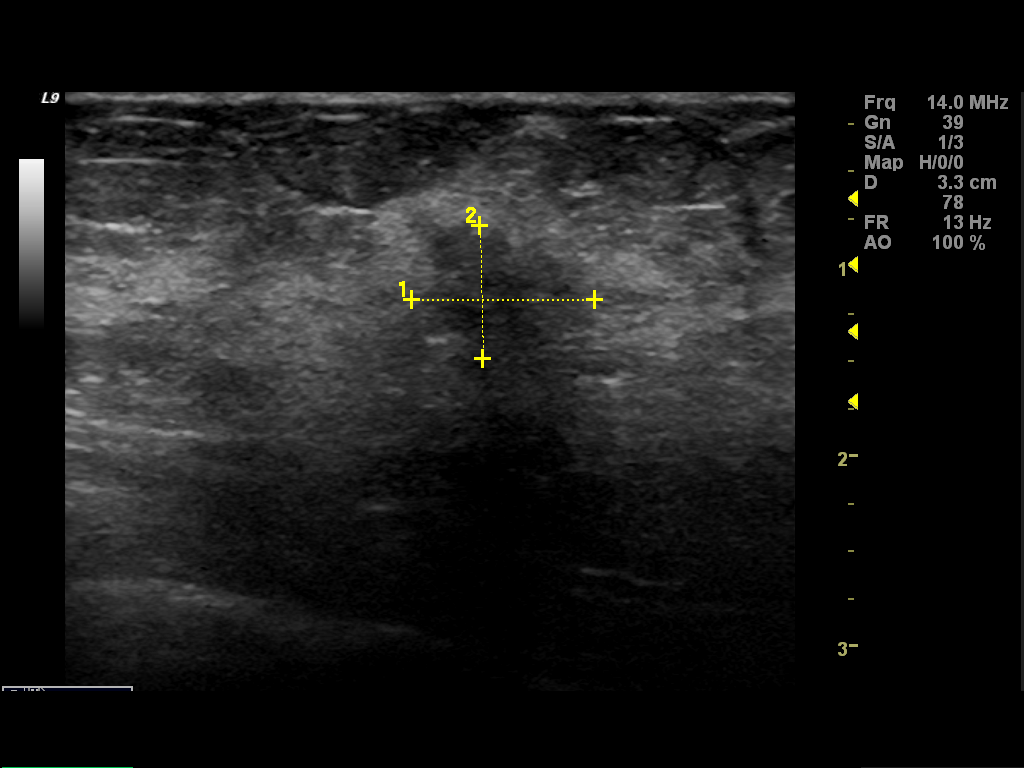
[im 3/4]
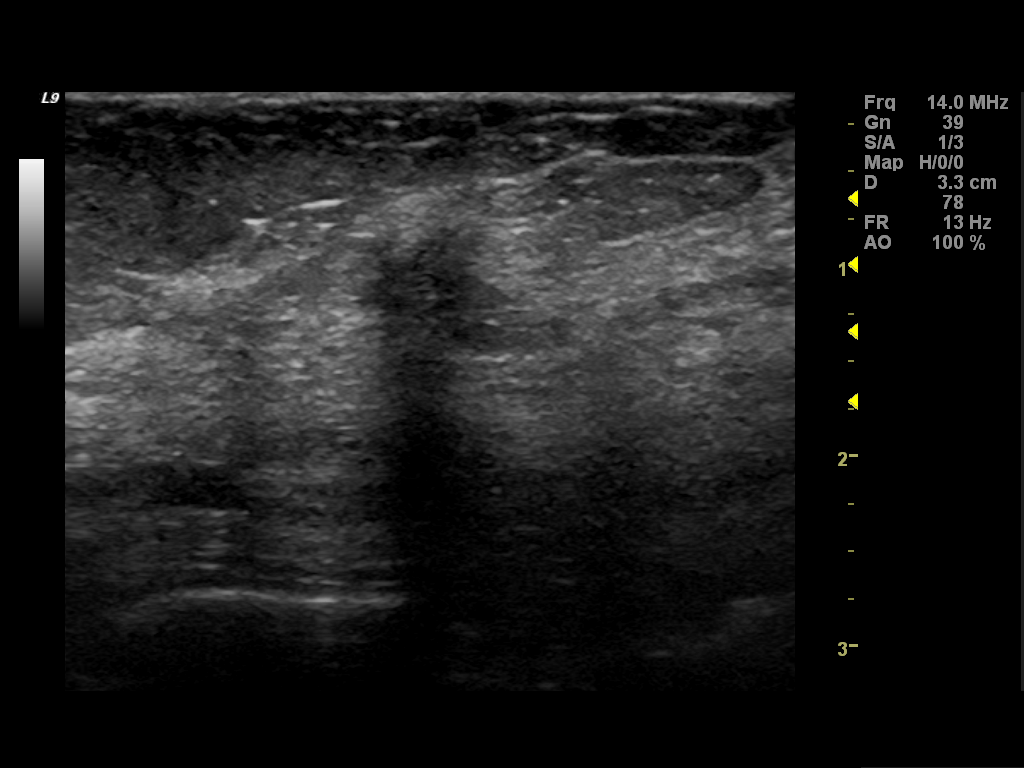
[im 4/4]
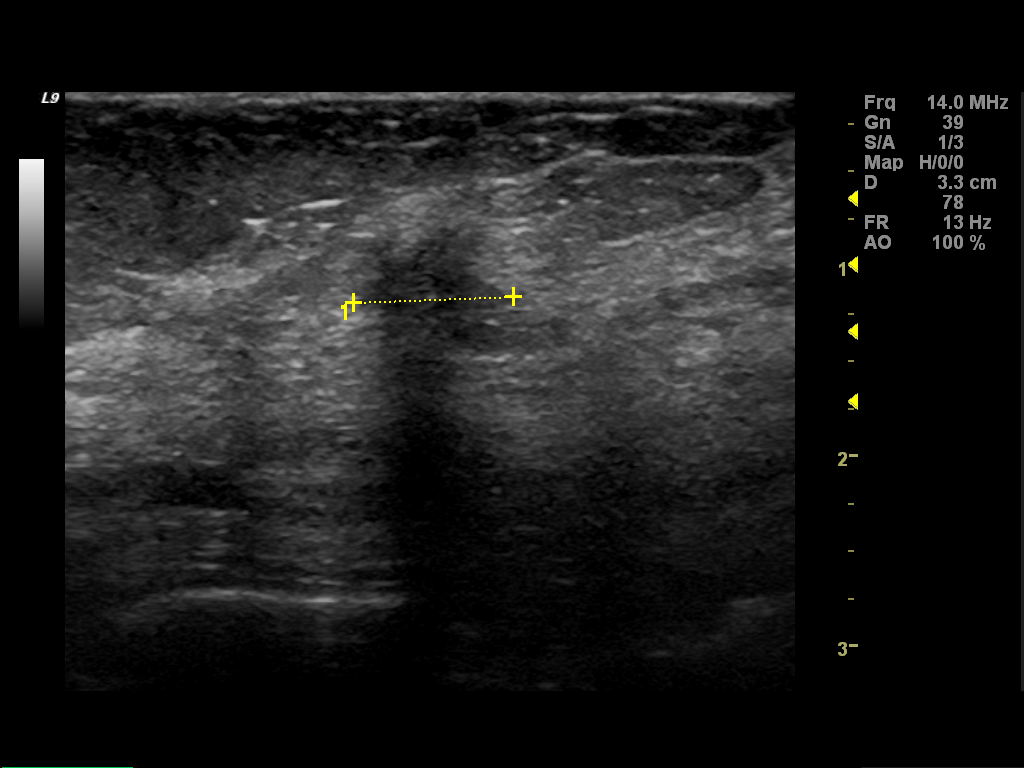

[4 of 4 positions shown; findings below may reference images not displayed]

FINDINGS: The breast tissue is heterogeneously dense.  There is a
persistent area of architectural distortion without central mass in
the right upper outer quadrant posteriorly.  The clip marking the
site of previous biopsy is approximately 16-20 mm inferior to the
area of distortion in the ML and MLO views.  No other abnormality
is noted.
Mammographic images were processed with CAD.

On physical exam, no mass is palpated in the outer portion of the
right breast.

Ultrasound is performed, showing an irregular hypoechoic area at 9
o'clock 6 cm from the right nipple measuring  8 x 10 x 7 mm. This
is not significantly changed from the area that was identified on
ultrasound study dated [DATE].  Images from the biopsy
performed on [DATE] demonstrate the needle within this
hypoechoic area.

A BB was placed on the skin over the sonographic abnormality and CC
and ML mammographic views were obtained. These views demonstrate
the BB to correspond to the area of distortion.

Findings may represent a radial scar (complex sclerosing lesion).
Invasive carcinoma is not excluded.  Given the possibility of
radial scar, I would suggest surgical consultation for
consideration of surgical excisional biopsy for definitive
diagnosis.
IMPRESSION: Architectural distortion in the 9 o'clock position of the right
breast.  Findings may represent radial scar versus invasive
carcinoma.  I would suggest surgical consultation for consideration
of surgical excisional biopsy for definitive diagnosis.

Results were discussed with the patient and her husband.  The
patient wishes to obtain a second opinion in [place] prior to
scheduling surgical appointment.

BI-RADS CATEGORY 4:  Suspicious abnormality - biopsy should be
considered.

<!--  IDXRADR:ADDEND:INNER_END -->
Addendum Ends
<!--  IDXRADR:ADDEND:END -->
FINDINGS: The breast tissue is heterogeneously dense.  There is a
persistent area of architectural distort

## 2011-12-26 IMAGING — MG MM DIAGNOSTIC UNILATERAL R
6 series · 6 of 6 positions shown · non-contrast
Comparison: [DATE], [DATE], [DATE].  [DATE] from
[REDACTED].
COMPARISON: [DATE], [DATE], [DATE].  [DATE] from
[REDACTED].

<!--  IDXRADR:ADDEND:BEGIN -->
Addendum Begins
<!--  IDXRADR:ADDEND:INNER_BEGIN -->
***ADDENDUM*** CREATED: [DATE] [DATE]

I have reviewed the right breast diagnostic mammogram and
ultrasound dated [DATE].  I do recommend surgical excision of
the area of distortion located laterally within the right breast at
approximately the 9 o'clock position.  The clip placed during
ultrasound guided core biopsy is located 14 mm inferior to the area
of distortion on today's right MLO view.  I have discussed the
findings with the patient and arranged appointment with Dr. PAKIKO
for [DATE] for consultation for possible surgical excision of
this area.
BI-RADS CATEGORY 4:  Suspicious abnormality - biopsy should be
considered.
Addended by:  PAKIKO, M.D. on [DATE] [DATE].
***END ADDENDUM*** SIGNED BY: PAKIKO, M.D.
CLINICAL DATA: The patient returns for short interval follow-up of
the right breast after ultrasound-guided core needle biopsy of the
9 o'clock position of the right breast demonstrated stromal
fibrosis on [DATE].
DIGITAL DIAGNOSTIC RIGHT MAMMOGRAM WITH CAD AND RIGHT BREAST
ULTRASOUND:

[R CC (1 of 3)]
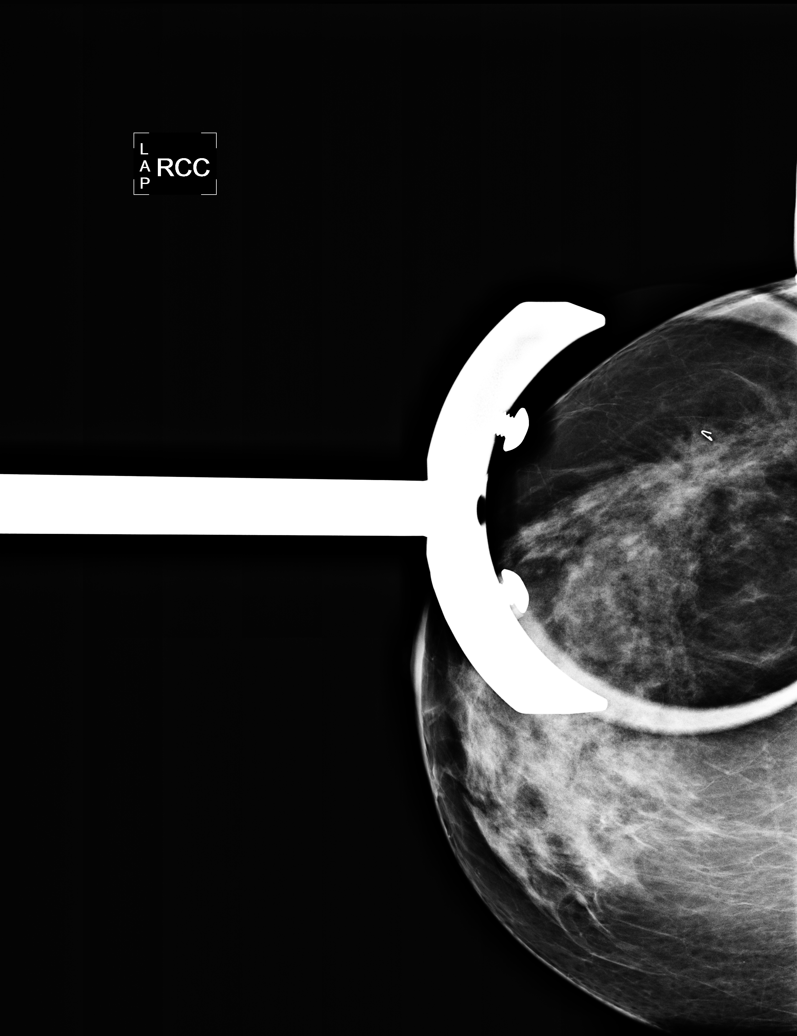

[R CC (2 of 3)]
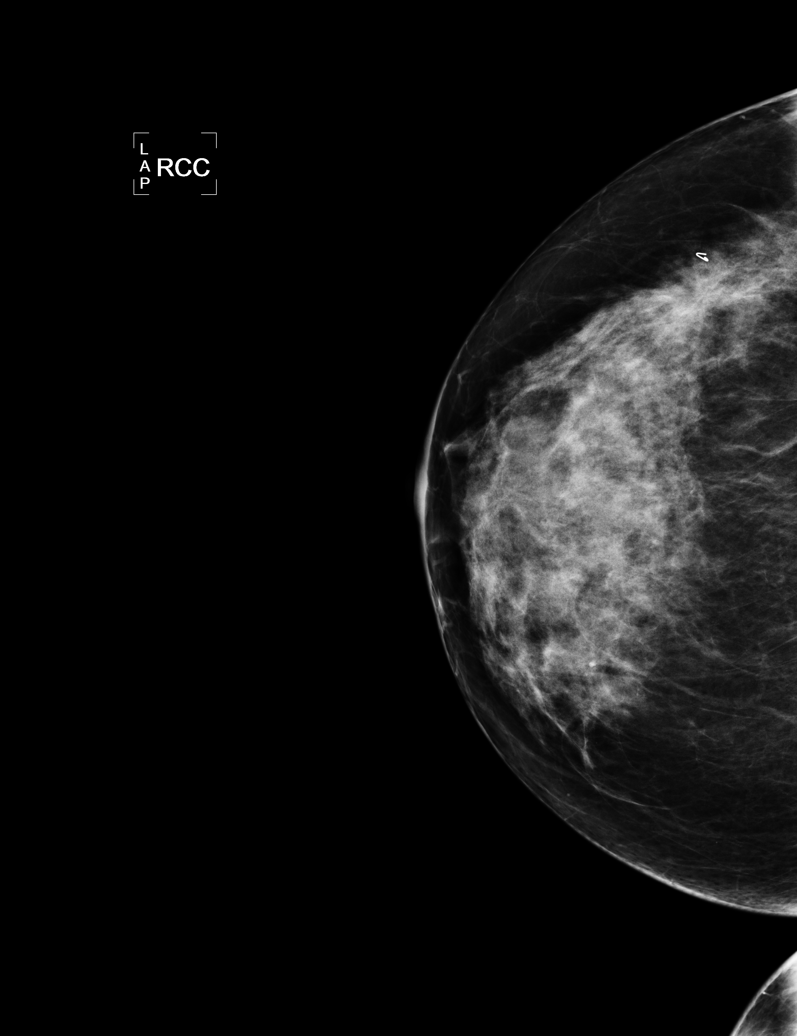

[R MLO (1 of 2)]
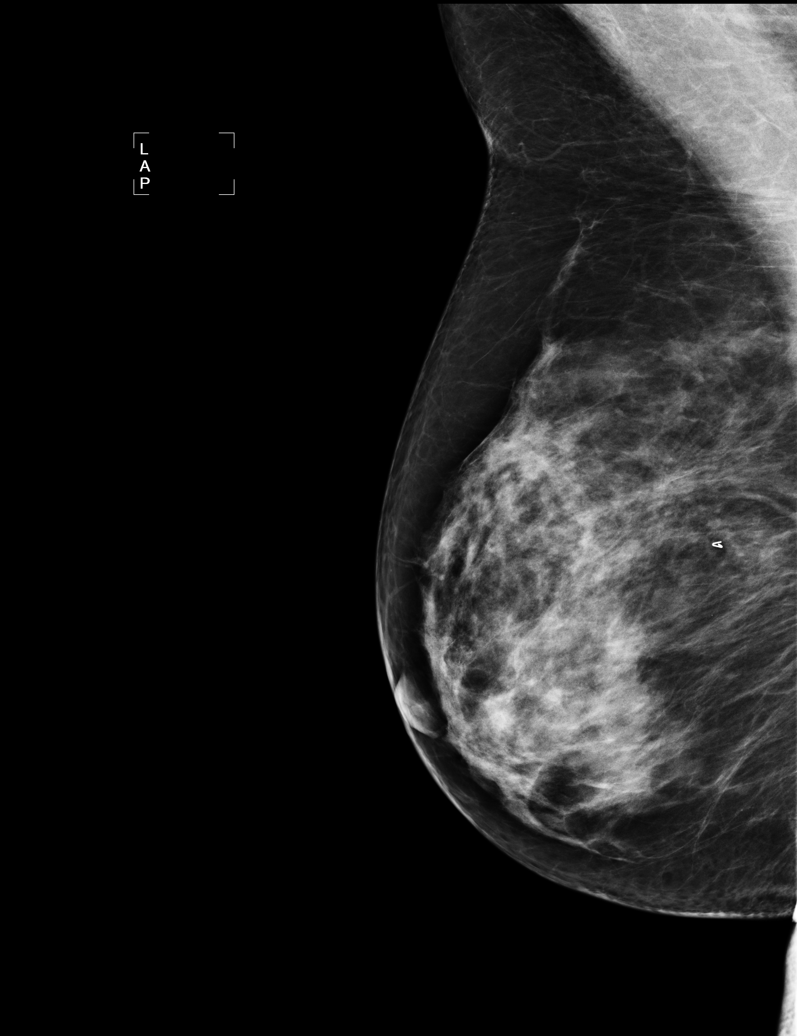

[R MLO (2 of 2)]
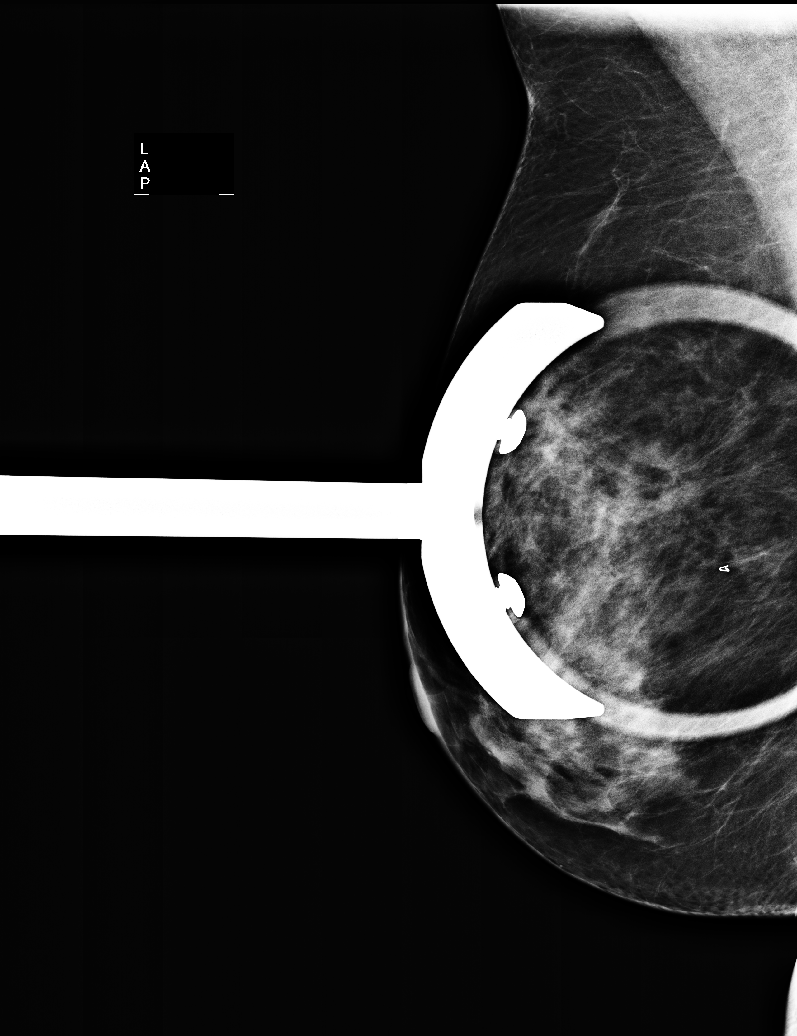

[R CC (3 of 3)]
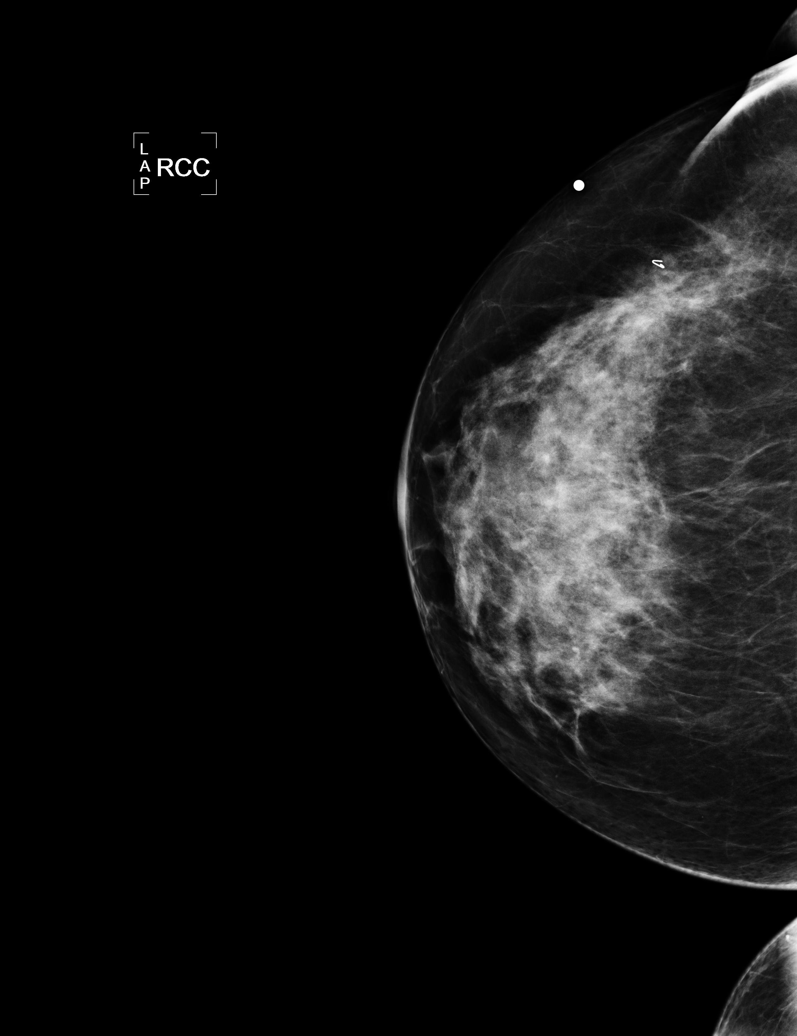

[R ML]
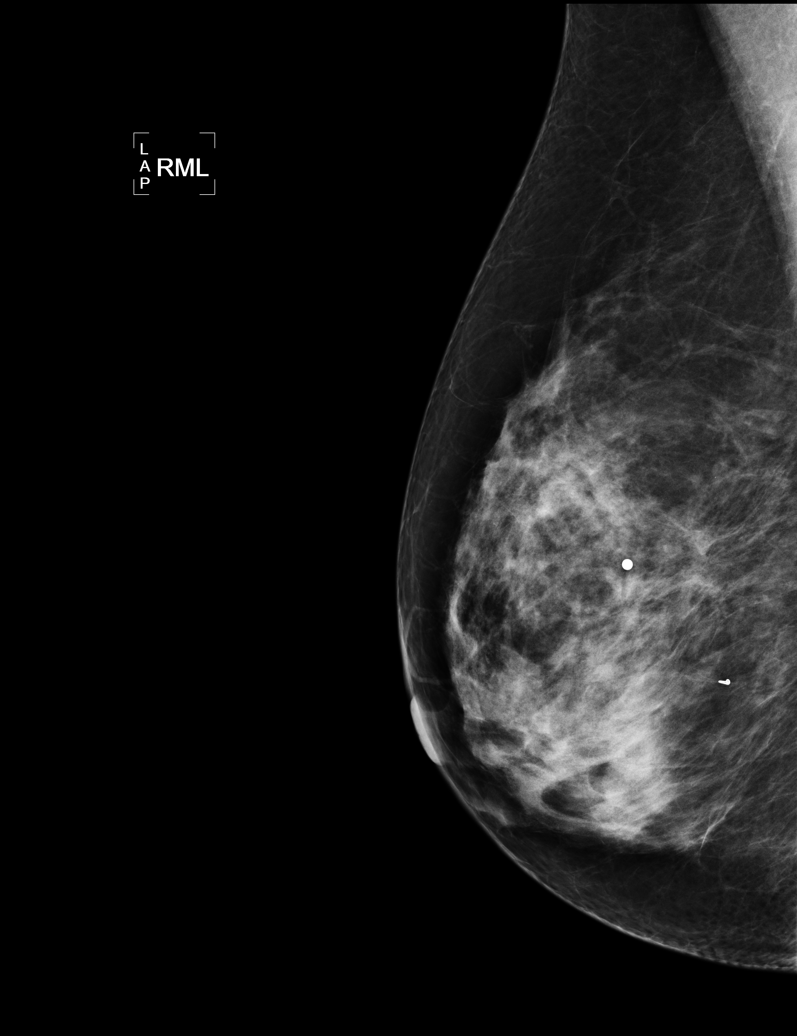

[6 of 6 positions shown; findings below may reference images not displayed]

FINDINGS: The breast tissue is heterogeneously dense.  There is a
persistent area of architectural distortion without central mass in
the right upper outer quadrant posteriorly.  The clip marking the
site of previous biopsy is approximately 16-20 mm inferior to the
area of distortion in the ML and MLO views.  No other abnormality
is noted.
Mammographic images were processed with CAD.

On physical exam, no mass is palpated in the outer portion of the
right breast.

Ultrasound is performed, showing an irregular hypoechoic area at 9
o'clock 6 cm from the right nipple measuring  8 x 10 x 7 mm. This
is not significantly changed from the area that was identified on
ultrasound study dated [DATE].  Images from the biopsy
performed on [DATE] demonstrate the needle within this
hypoechoic area.

A BB was placed on the skin over the sonographic abnormality and CC
and ML mammographic views were obtained. These views demonstrate
the BB to correspond to the area of distortion.

Findings may represent a radial scar (complex sclerosing lesion).
Invasive carcinoma is not excluded.  Given the possibility of
radial scar, I would suggest surgical consultation for
consideration of surgical excisional biopsy for definitive
diagnosis.
IMPRESSION: Architectural distortion in the 9 o'clock position of the right
breast.  Findings may represent radial scar versus invasive
carcinoma.  I would suggest surgical consultation for consideration
of surgical excisional biopsy for definitive diagnosis.

Results were discussed with the patient and her husband.  The
patient wishes to obtain a second opinion in [place] prior to
scheduling surgical appointment.

BI-RADS CATEGORY 4:  Suspicious abnormality - biopsy should be
considered.

<!--  IDXRADR:ADDEND:INNER_END -->
Addendum Ends
<!--  IDXRADR:ADDEND:END -->
FINDINGS: The breast tissue is heterogeneously dense.  There is a
persistent area of architectural distort

## 2012-01-19 ENCOUNTER — Encounter (INDEPENDENT_AMBULATORY_CARE_PROVIDER_SITE_OTHER): Payer: Self-pay | Admitting: Surgery

## 2012-01-19 ENCOUNTER — Ambulatory Visit (INDEPENDENT_AMBULATORY_CARE_PROVIDER_SITE_OTHER): Payer: BC Managed Care – PPO | Admitting: Surgery

## 2012-01-19 VITALS — BP 112/70 | HR 64 | Resp 16 | Ht 66.0 in | Wt 156.0 lb

## 2012-01-19 DIAGNOSIS — L905 Scar conditions and fibrosis of skin: Secondary | ICD-10-CM

## 2012-01-19 DIAGNOSIS — N6489 Other specified disorders of breast: Secondary | ICD-10-CM | POA: Insufficient documentation

## 2012-01-19 NOTE — Patient Instructions (Signed)
We have discussed surgical biopsy of the area that appears to be a radial scar in the right breast. You are planning to review the xrays with the radiologist and make a decision about surgery or follow up in six months.

## 2012-01-19 NOTE — Progress Notes (Signed)
NAMEBRAYLEIGH Greene DOB: 04/01/1961 MRN: [id number]                                                                                      DATE: 01/19/2012  PCP: Olen Cordial, MD, MD Referring Provider: Romero Belling, MD  IMPRESSION:  Area in Right breast abnormal on mammogram, possible radial scar  PLAN:   I had a 20 minute discussion with the patient and her husband her mammogram films and reports the biopsy report and the options. We talked about a six month followup versus a needle guide of the surgical excision. Discussed risks and complications including bleeding infection and the fact that we could miss the area. We talked about the fact that the previous biopsy may have also noticed the area of architectural distortion. We have discussed this may be malignant but if so probably slow growing since he has not been a lot of change since September. It also may be benign.  The patient had a lot of questions which we try to review. At the end of the discussion she wanted to review the actual films with the radiologist and compare the current once with the ones from last year to see if that'll help her make a decision about whether to go ahead with surgical biopsy this time will defer it for another six months.  Once she has made up her mind she will give me a call.                 CC:  Chief Complaint  Patient presents with  . Breast Mass    HPI:  Kristina Greene is a 51 y.o.  female who presents for evaluation of She presents for discussion of a possible cause more localized excision of an area in the right breast it appears to be a radial scar. A biopsy back in September showed some fibrosis. A followup mammogram shows the clip the biopsy to be below the area that was abnormal on mammogram although small area seen on ultrasound is still present and the needle appeared to go directly through it on the biopsy. The patient is completely asymptomatic in terms of this. She's been told that she has  fibrocystic changes. She was in [place] and had ultrasound done and was told was normal. She was told there this is all fibrocystic and benign.  PMH:  has a past medical history of Hyperlipidemia; Thyroid disease; and History of thyroid cancer.  PSH:   has past surgical history that includes Thyroidectomy (2009) and Appendectomy (1986).  ALLERGIES:  No Known Allergies  MEDICATIONS: Current outpatient prescriptions:SYNTHROID 88 MCG tablet, , Disp: , Rfl: ;  UNABLE TO FIND, Med Name: per patient levothrine 5 mg, Disp: , Rfl:   ROS: She has filled out our 12 point review of systems and it is negative.   EXAM:   GENERAL:  The patient is alert, oriented, and generally healthy-appearing, NAD. Mood and affect are normal.  HEENT:  The head is normocephalic, the eyes nonicteric, the pupils were round regular and equal. EOMs are normal. Pharynx normal. Dentition good.  NECK:  The neck is supple and there are no  masses or thyromegaly.Wellhealed scar from thyroidectomy  LUNGS: Normal respirations and clear to auscultation.  HEART: Regular rhythm, with no murmurs rubs or gallops. Pulses are intact carotid dorsalis pedis and posterior tibial. No significant varicosities are noted.  BREASTS: There is a question of a density about 7 cm from the nipple, about the 10 o'clock position on the right. Other than that the exam is normal.  LYMPHATICS: No axillary or supraclavicular adenopathy on either side.  ABDOMEN: Soft, flat, and nontender. No masses or organomegaly is noted. No hernias are noted. Bowel sounds are normal.  EXTREMITIES:  Good range of motion, no edema.   DATA REVIEWED:  Mammogram and path reports    Kristina Greene J 01/19/2012  CC: Romero Belling, MD, Olen Cordial, MD, MD

## 2012-03-29 ENCOUNTER — Encounter: Payer: Self-pay | Admitting: Internal Medicine

## 2012-08-03 ENCOUNTER — Other Ambulatory Visit: Payer: Self-pay | Admitting: Gynecology

## 2012-08-03 DIAGNOSIS — R922 Inconclusive mammogram: Secondary | ICD-10-CM

## 2012-08-17 ENCOUNTER — Ambulatory Visit
Admission: RE | Admit: 2012-08-17 | Discharge: 2012-08-17 | Disposition: A | Discharge status: Home / Self Care | Payer: BC Managed Care – PPO | Source: Ambulatory Visit | Attending: Gynecology | Admitting: Gynecology

## 2012-08-17 DIAGNOSIS — R922 Inconclusive mammogram: Secondary | ICD-10-CM

## 2012-08-18 IMAGING — MG MM DIGITAL DIAGNOSTIC BILAT
4 series · 4 of 4 positions shown · non-contrast
Comparison: [DATE], [DATE], [DATE], [DATE],
[DATE].

CLINICAL DATA: The patient underwent an ultrasound guided core
biopsy [DATE] for a hypoechoic area with associated distortion
which demonstrated stromal fibrosis.  Stromal fibrosis with a
complex sclerosing lesion could produce this appearance.  However,
this could be secondary to invasive mammary carcinoma.  This was
discussed with the patient and surgical excision was recommended.
She saw Dr. ASKEW on [DATE].  Following this consultation,  I
also again discussed the findings by telephone with the patient and
did recommend that the area be excised.  She was not ready for
surgery excision at that time.  She now returns for follow-up
evaluation.

DIGITAL DIAGNOSTIC BILATERAL MAMMOGRAM WITH CAD

[R CC]
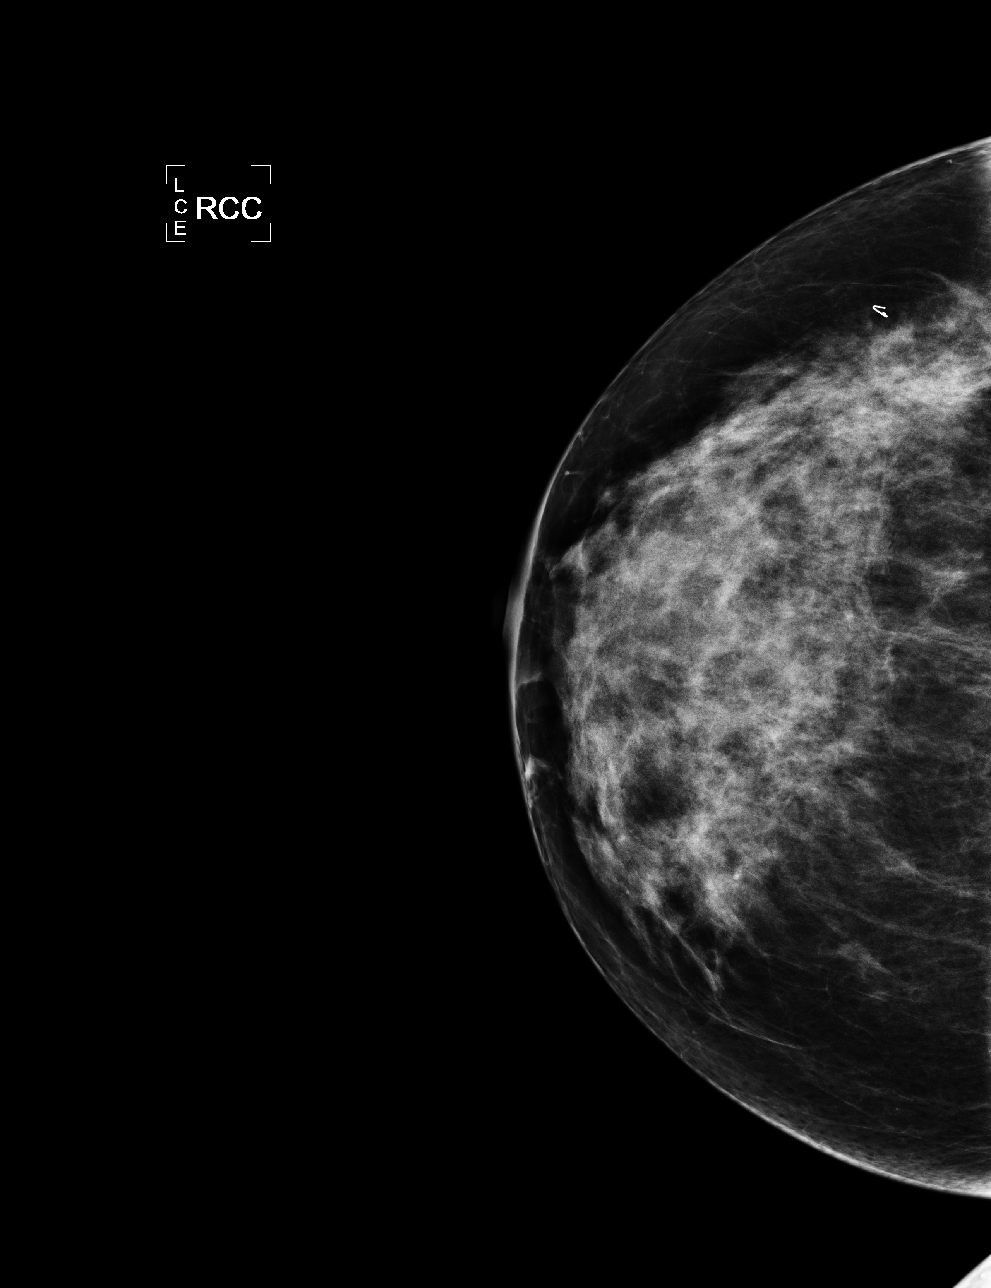

[L CC]
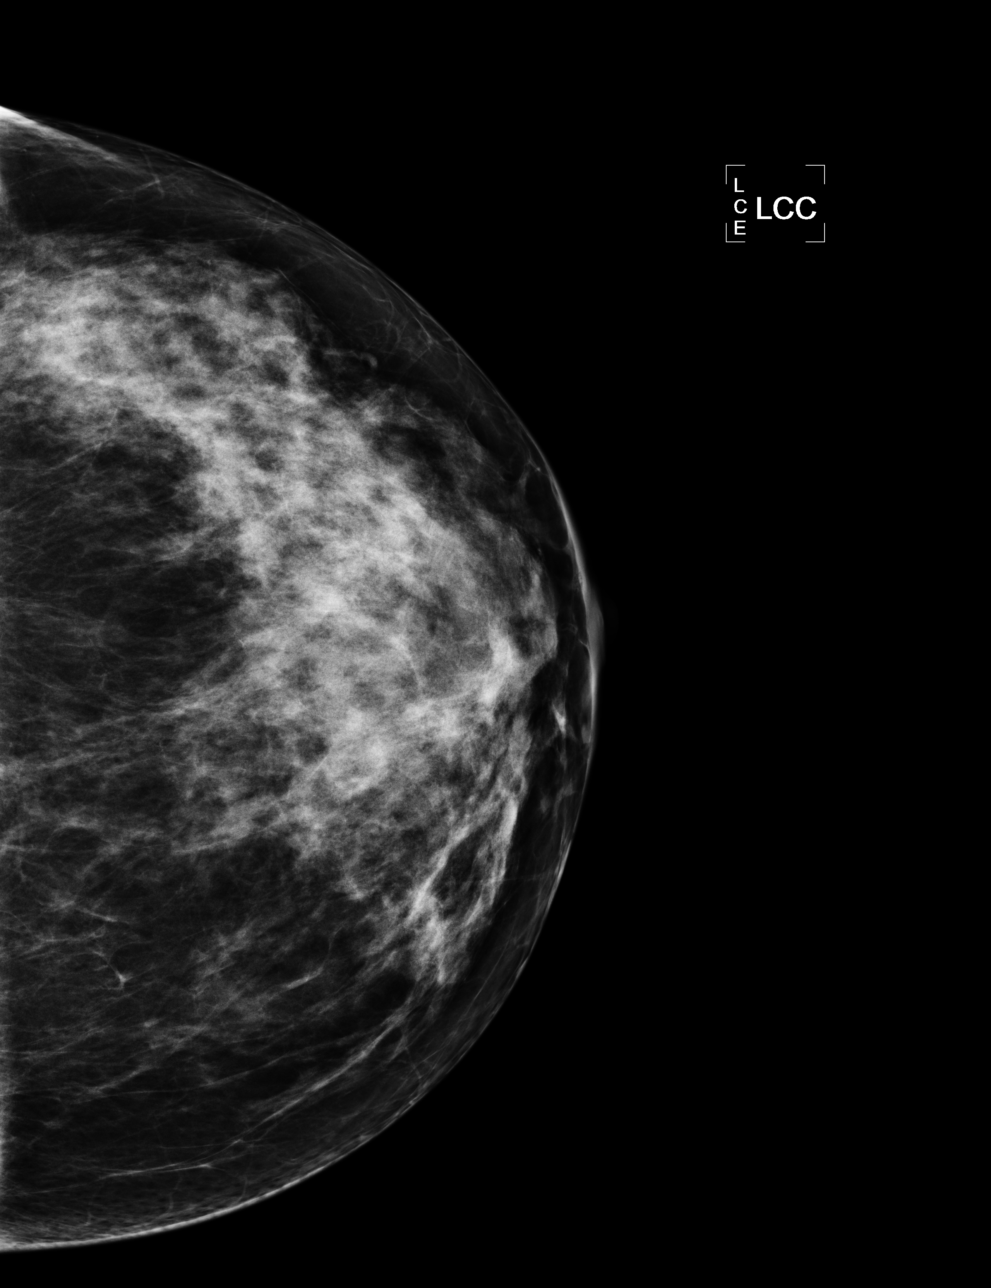

[L MLO]
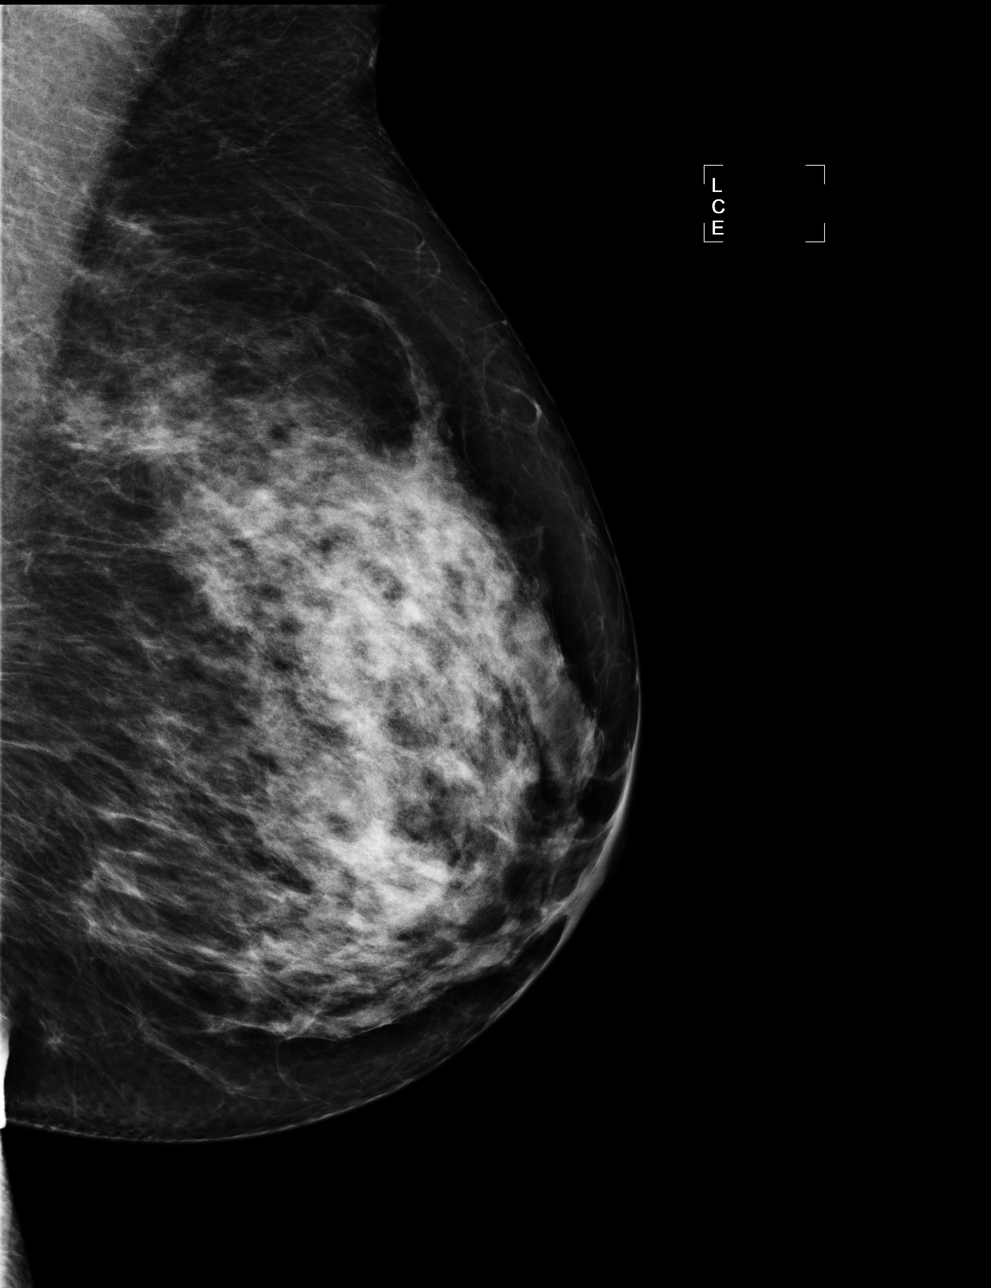

[R MLO]
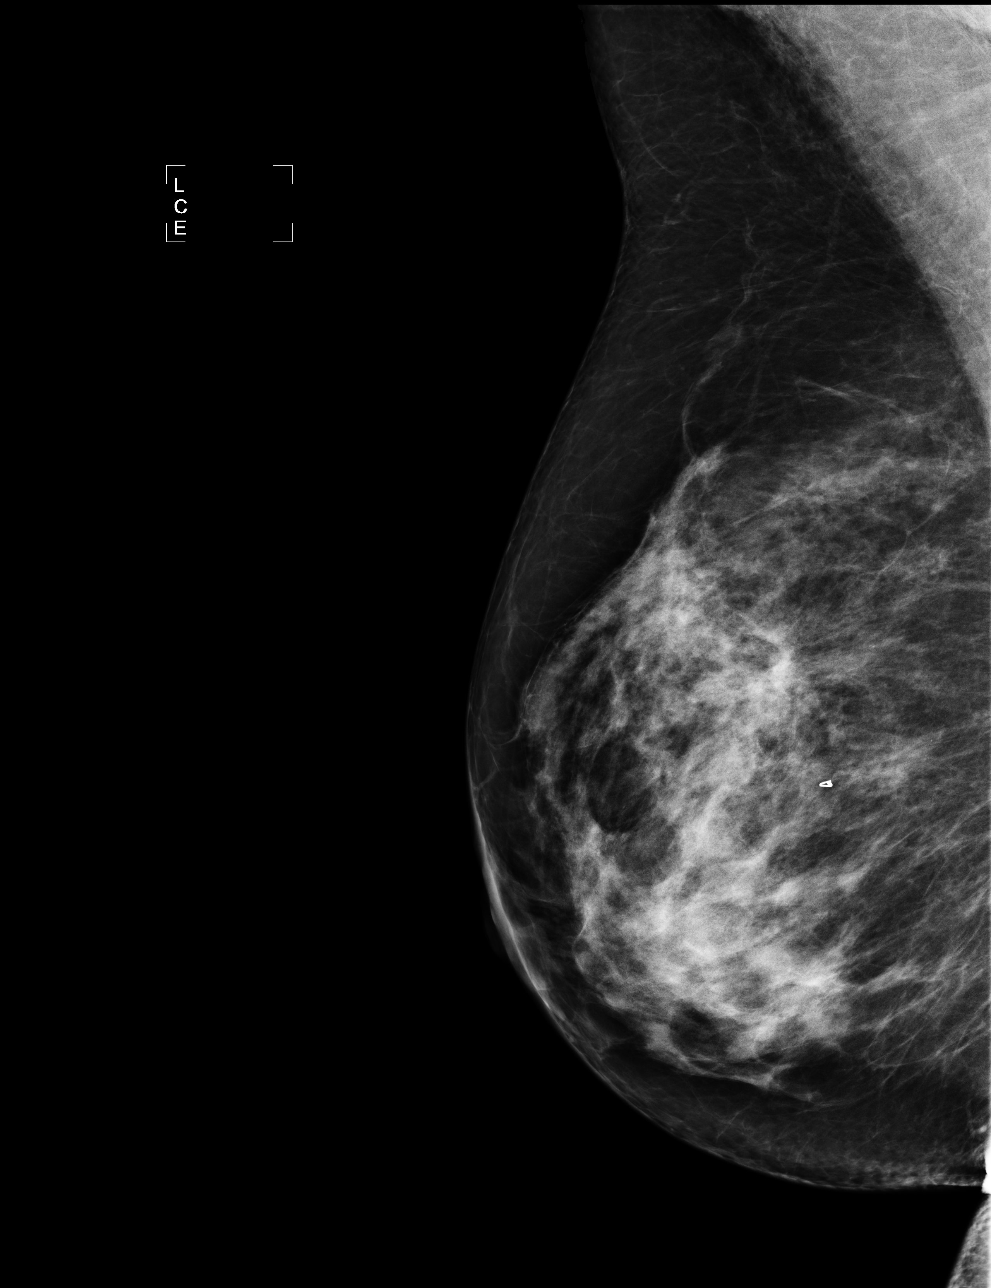

[4 of 4 positions shown; findings below may reference images not displayed]

FINDINGS: There is a heterogeneously dense parenchymal pattern.
There is an area of persistent stable distortion located within the
upper-outer quadrant of the right breast.  There is a clip located
along the lateral aspect of this area of distortion and located 15
mm inferior to the distortion on the MLO view. There are no
worrisome calcifications.  Left breast is unchanged.
Mammographic images were processed with CAD.
IMPRESSION: Persistent distortion within the upper-outer quadrant of the right
breast.  I have discussed the findings with the patient and
recommend surgical excision.  She wants to proceed with surgical
excision and repeat consultation with Dr. ASKEW has been arranged
for [DATE].

RECOMMENDATION:
Right breast surgical excision following needle localization of the
area of distortion.

I have discussed the findings and recommendations with the patient.
Results were also provided in writing at the conclusion of the
visit.

BI-RADS CATEGORY 4:  Suspicious abnormality - biopsy should be
considered.

## 2012-09-07 ENCOUNTER — Ambulatory Visit (INDEPENDENT_AMBULATORY_CARE_PROVIDER_SITE_OTHER): Payer: BC Managed Care – PPO | Admitting: Surgery

## 2012-09-07 ENCOUNTER — Encounter (INDEPENDENT_AMBULATORY_CARE_PROVIDER_SITE_OTHER): Payer: Self-pay | Admitting: Surgery

## 2012-09-07 VITALS — BP 116/70 | HR 76 | Temp 97.2°F | Resp 16 | Ht 64.0 in | Wt 156.6 lb

## 2012-09-07 DIAGNOSIS — N6489 Other specified disorders of breast: Secondary | ICD-10-CM

## 2012-09-07 DIAGNOSIS — L905 Scar conditions and fibrosis of skin: Secondary | ICD-10-CM

## 2012-09-07 NOTE — Progress Notes (Signed)
Patient ID: Kristina Greene, female   DOB: 08/22/1961, 51 y.o.   MRN: [id number]  Chief Complaint  Patient presents with  . Follow-up    reck rt br mass - discuss Sx    HPI Kristina Greene is a 51 y.o. female.  I saw this patient about a year ago with an indeterminate area in the breast that had been biopsied and showed fibrosis possibly radial scar. Excisional biopsy had been recommended but declined by the patient. This area has been followed up there still an abnormality present although not significantly different. However the patient now wishes to have this surgically excised. She's had no further symptoms or problems. HPI  Past Medical History  Diagnosis Date  . Hyperlipidemia   . Thyroid disease   . History of thyroid cancer     Past Surgical History  Procedure Date  . Thyroidectomy 2009  . Appendectomy 1986    History reviewed. No pertinent family history.  Social History History  Substance Use Topics  . Smoking status: Never Smoker   . Smokeless tobacco: Not on file  . Alcohol Use: No    No Known Allergies  Current Outpatient Prescriptions  Medication Sig Dispense Refill  . SYNTHROID 88 MCG tablet       . UNABLE TO FIND Med Name: per patient levothrine 5 mg        Review of Systems Review of Systems Unchanged from last year Blood pressure 116/70, pulse 76, temperature 97.2 F (36.2 C), temperature source Temporal, resp. rate 16, height 5' 4" (1.626 m), weight 156 lb 9.6 oz (71.033 kg), last menstrual period 08/16/2012.  Physical Exam Physical Exam VITAL SIGNS: BP 116/70  Pulse 76  Temp 97.2 F (36.2 C) (Temporal)  Resp 16  Ht 5' 4" (1.626 m)  Wt 156 lb 9.6 oz (71.033 kg)  BMI 26.88 kg/m2  LMP 08/16/2012 GENERAL:  The patient is alert, oriented, and generally healthy-appearing, NAD. Mood and affect are normal.  HEENT:  The head is normocephalic, the eyes nonicteric, the pupils were round regular and equal. EOMs are normal. Pharynx normal. Dentition  good.  NECK:  The neck is supple and there are no masses or thyromegaly.  LUNGS: Normal respirations and clear to auscultation.  HEART: Regular rhythm, with no murmurs rubs or gallops. Pulses are intact carotid dorsalis pedis and posterior tibial. No significant varicosities are noted.  BREASTS: The breasts Are symmetric and without dominant mass. No abnormalities are noted.  LYMPHATICS: No axillary or supraclavicular adenopathy is noted. ABDOMEN: Soft, flat, and nontender. No masses or organomegaly is noted. No hernias are noted. Bowel sounds are normal.  EXTREMITIES:  Good range of motion, no edema. Data Reviewed I have reviewed my old notes as well as radiology reports from last year and this year  Current mammogram: IMPRESSION:  Persistent distortion within the upper-outer quadrant of the right  breast. I have discussed the findings with the patient and  recommend surgical excision. She wants to proceed with surgical  excision and repeat consultation with Dr. Matias Thurman has been arranged  for 09/07/2012.  RECOMMENDATION:  Right breast surgical excision following needle localization of the  area of distortion.  I have discussed the findings and recommendations with the patient.  Results were also provided in writing at the conclusion of the  visit.  BI-RADS CATEGORY 4: Suspicious abnormality - biopsy should be  considered.  Original Report Authenticated By: Covington JACKSON, M.D   Assessment    Mammographic abnormality of uncertain significance      Plan    I discussed the alternatives with the patient. A wire localized excision is possible. Given the stability of this area is now more likely to be benign but not, but still indeterminate. She wishes to proceed. I have discussed the indications for the lumpectomy and described the procedure. She understand that the chance of removal of the abnormal area is very good, but that occasionally we are unable to locate it and  may have to do a second procedure. We also discussed the possibility of a second procedure to get additional tissue. Risks of surgery such as bleeding and infection have also been explained, as well as the implications of not doing the surgery. She understands and wishes to proceed.        Kristina Greene J 09/07/2012, 1:55 PM      

## 2012-09-07 NOTE — Patient Instructions (Signed)
We will schedule surgical biopsy for the area of abnormality in your right breast

## 2012-09-13 ENCOUNTER — Other Ambulatory Visit (INDEPENDENT_AMBULATORY_CARE_PROVIDER_SITE_OTHER): Payer: Self-pay | Admitting: Surgery

## 2012-09-13 DIAGNOSIS — N6489 Other specified disorders of breast: Secondary | ICD-10-CM

## 2012-09-23 ENCOUNTER — Encounter (HOSPITAL_BASED_OUTPATIENT_CLINIC_OR_DEPARTMENT_OTHER): Payer: Self-pay | Admitting: *Deleted

## 2012-09-23 NOTE — Progress Notes (Signed)
No labs needed

## 2012-09-27 ENCOUNTER — Ambulatory Visit
Admission: RE | Admit: 2012-09-27 | Discharge: 2012-09-27 | Disposition: A | Discharge status: Home / Self Care | Payer: BC Managed Care – PPO | Source: Ambulatory Visit | Attending: Surgery | Admitting: Surgery

## 2012-09-27 ENCOUNTER — Encounter (HOSPITAL_BASED_OUTPATIENT_CLINIC_OR_DEPARTMENT_OTHER): Payer: Self-pay | Admitting: Anesthesiology

## 2012-09-27 ENCOUNTER — Ambulatory Visit (HOSPITAL_BASED_OUTPATIENT_CLINIC_OR_DEPARTMENT_OTHER): Payer: BC Managed Care – PPO | Admitting: Anesthesiology

## 2012-09-27 ENCOUNTER — Encounter (HOSPITAL_BASED_OUTPATIENT_CLINIC_OR_DEPARTMENT_OTHER)
Admission: RE | Disposition: A | Discharge status: Home / Self Care | Payer: Self-pay | Source: Ambulatory Visit | Attending: Surgery

## 2012-09-27 ENCOUNTER — Encounter (HOSPITAL_BASED_OUTPATIENT_CLINIC_OR_DEPARTMENT_OTHER): Payer: Self-pay

## 2012-09-27 ENCOUNTER — Ambulatory Visit (HOSPITAL_BASED_OUTPATIENT_CLINIC_OR_DEPARTMENT_OTHER)
Admission: RE | Admit: 2012-09-27 | Discharge: 2012-09-27 | Disposition: A | Discharge status: Home / Self Care | Payer: BC Managed Care – PPO | Source: Ambulatory Visit | Attending: Surgery | Admitting: Surgery

## 2012-09-27 DIAGNOSIS — E039 Hypothyroidism, unspecified: Secondary | ICD-10-CM | POA: Insufficient documentation

## 2012-09-27 DIAGNOSIS — N6489 Other specified disorders of breast: Secondary | ICD-10-CM

## 2012-09-27 DIAGNOSIS — K219 Gastro-esophageal reflux disease without esophagitis: Secondary | ICD-10-CM | POA: Insufficient documentation

## 2012-09-27 DIAGNOSIS — N6089 Other benign mammary dysplasias of unspecified breast: Secondary | ICD-10-CM

## 2012-09-27 DIAGNOSIS — Z9089 Acquired absence of other organs: Secondary | ICD-10-CM | POA: Insufficient documentation

## 2012-09-27 DIAGNOSIS — E785 Hyperlipidemia, unspecified: Secondary | ICD-10-CM | POA: Insufficient documentation

## 2012-09-27 DIAGNOSIS — N62 Hypertrophy of breast: Secondary | ICD-10-CM | POA: Insufficient documentation

## 2012-09-27 HISTORY — DX: Gastro-esophageal reflux disease without esophagitis: K21.9

## 2012-09-27 HISTORY — DX: Hypothyroidism, unspecified: E03.9

## 2012-09-27 HISTORY — DX: Other specified health status: Z78.9

## 2012-09-27 HISTORY — PX: BREAST BIOPSY: SHX20

## 2012-09-27 HISTORY — DX: Allergy status to unspecified drugs, medicaments and biological substances: Z88.9

## 2012-09-27 HISTORY — PX: BREAST EXCISIONAL BIOPSY: SUR124

## 2012-09-27 IMAGING — MG MM BREAST WIRE LOCALIZATION*R*
5 series · 5 of 5 positions shown · non-contrast
Comparison: Previous exams.

CLINICAL DATA: Right breast architectural distortion.

NEEDLE LOCALIZATION WITH MAMMOGRAPHIC GUIDANCE AND SPECIMEN
RADIOGRAPH

[R CC (1 of 3)]
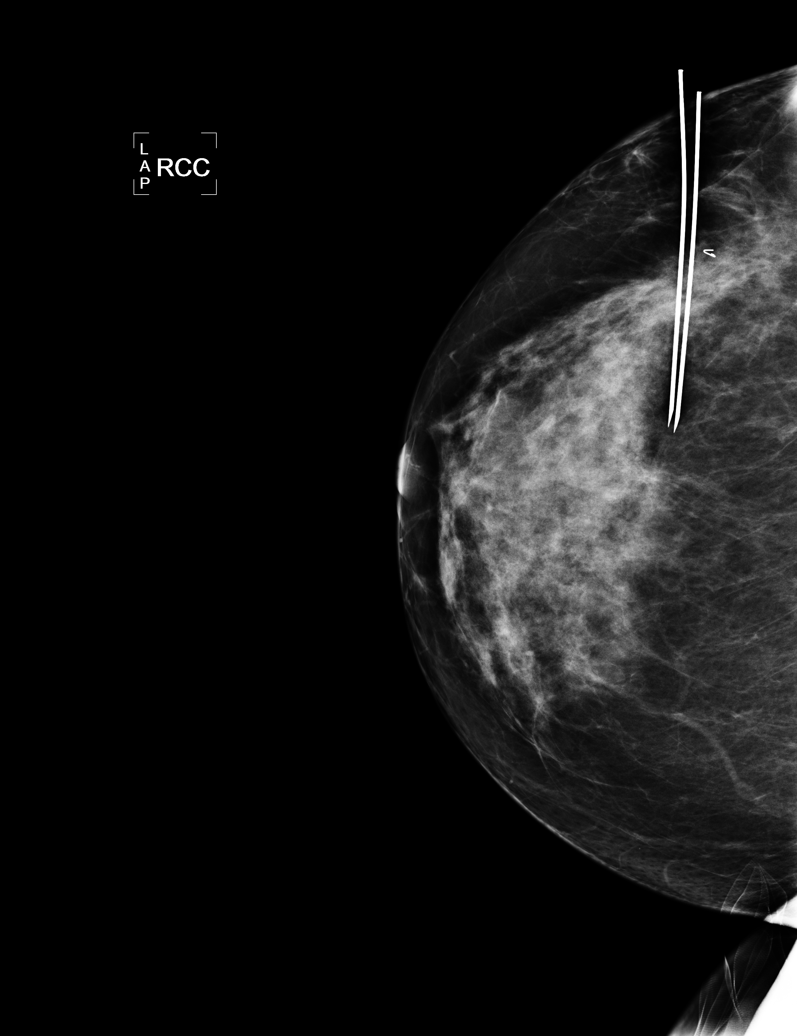

[R LM (1 of 2)]
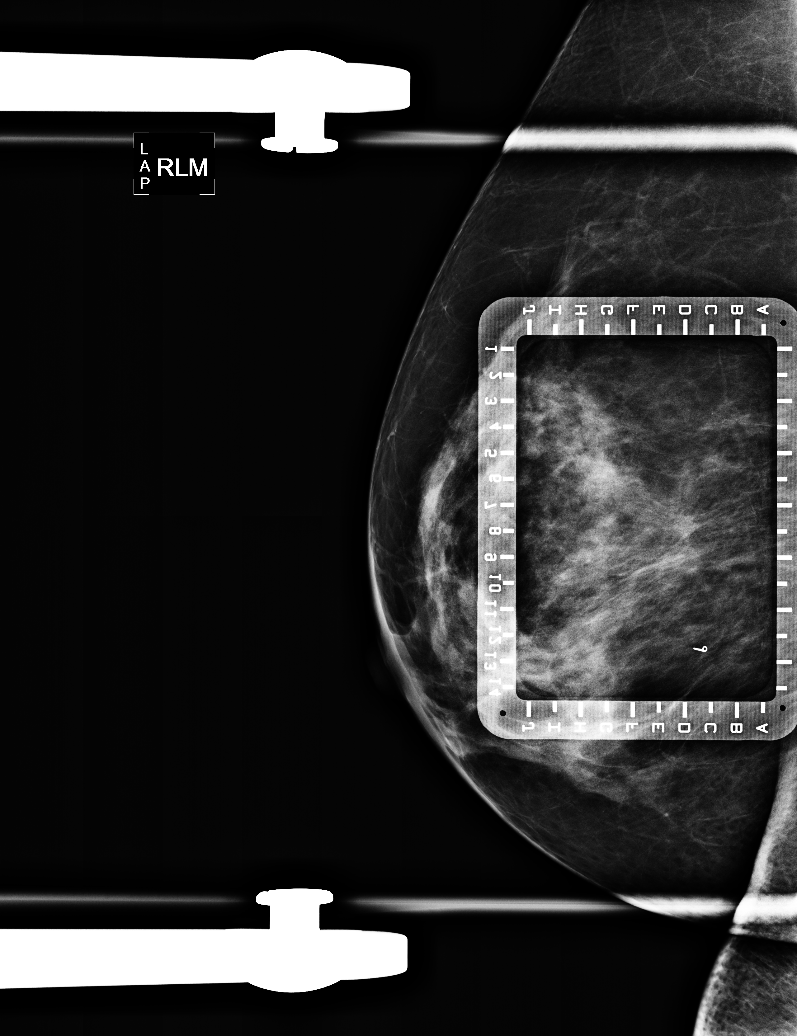

[R CC (2 of 3)]
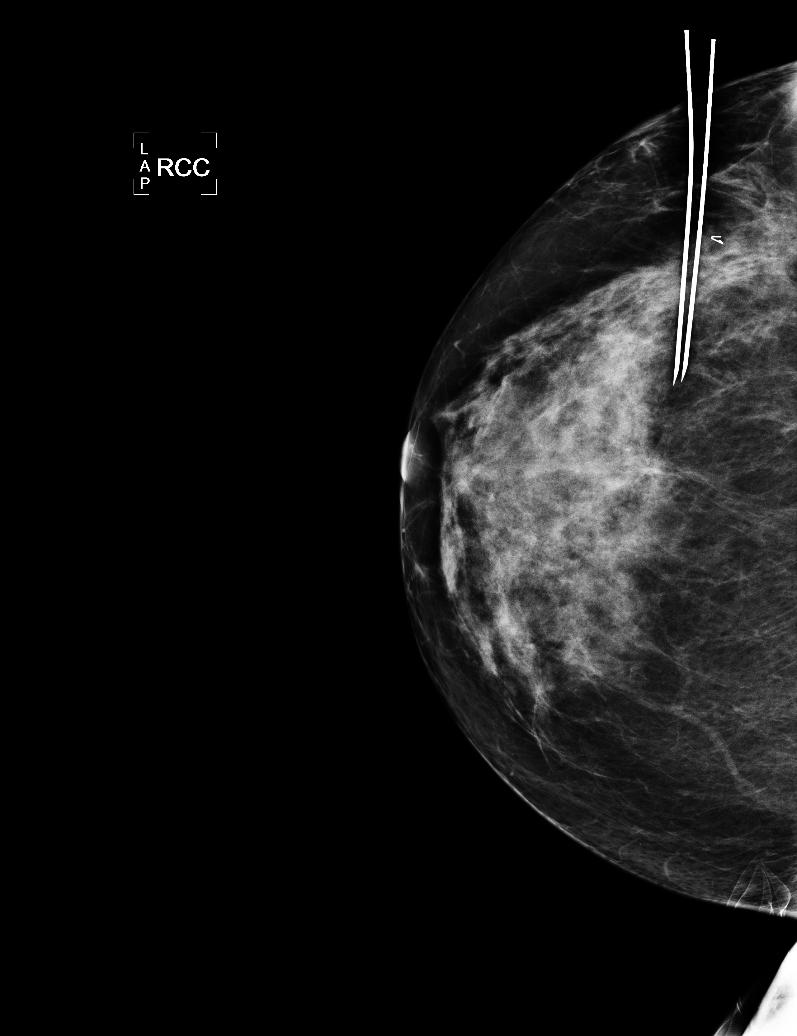

[R LM (2 of 2)]
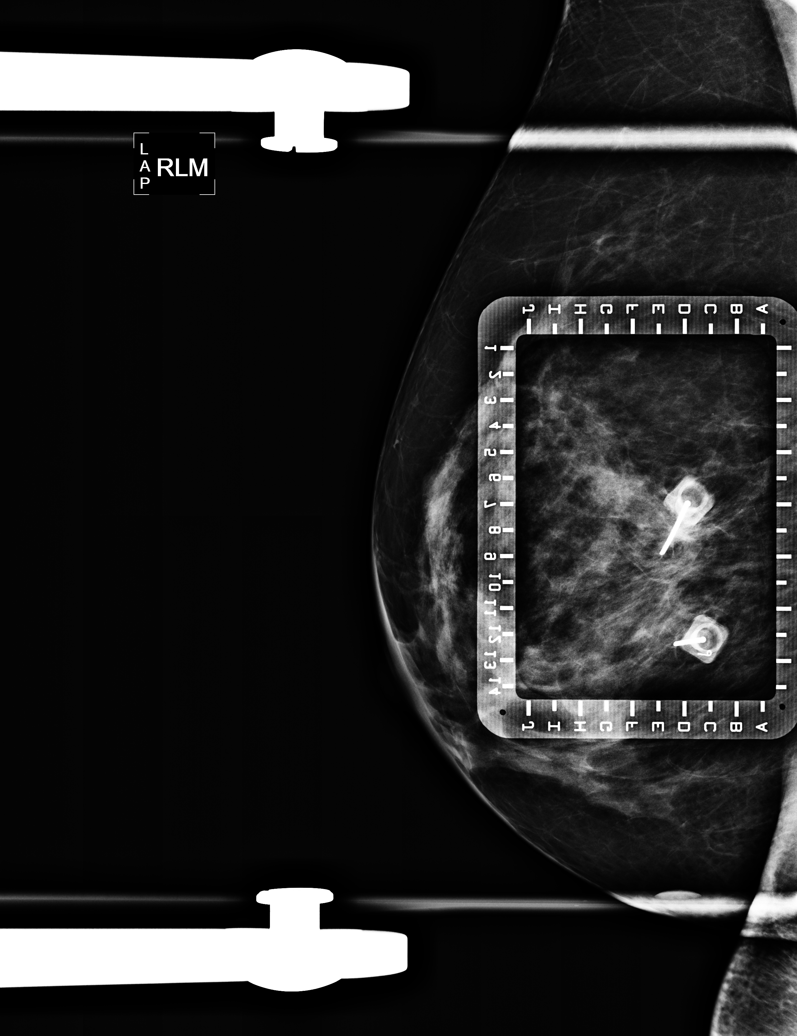

[R CC (3 of 3)]
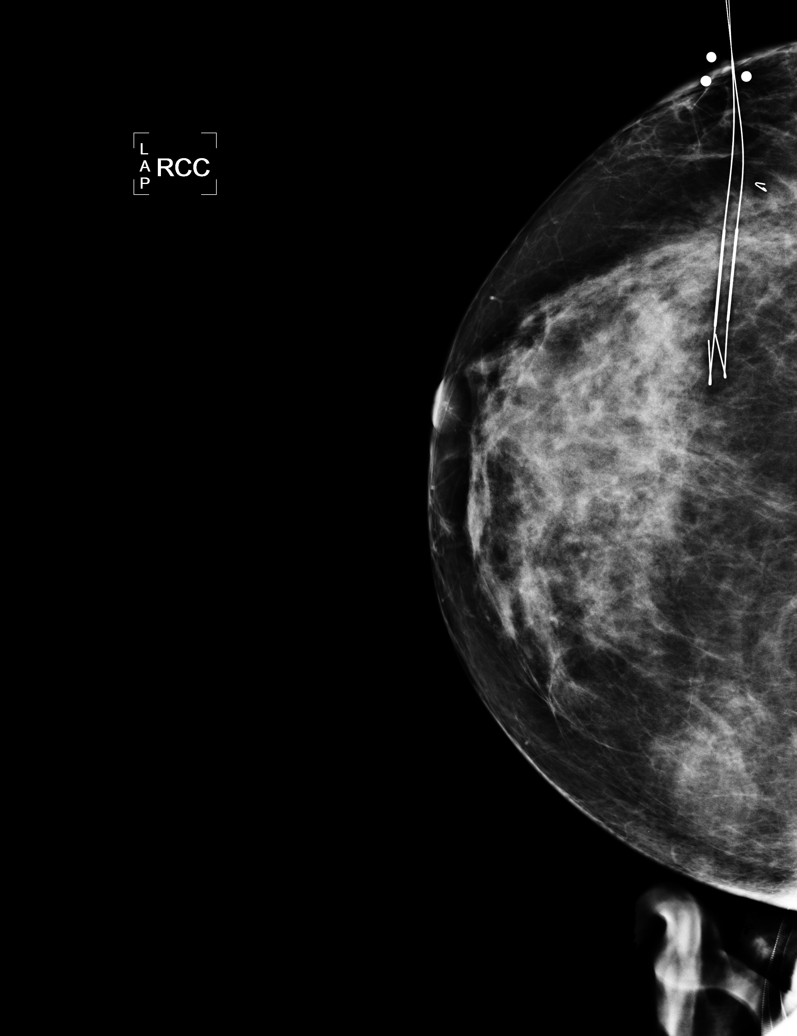

[5 of 5 positions shown; findings below may reference images not displayed]

Patient presents for needle localization prior to needle
localization of right breast architectural distortion.  I met with
the patient and we discussed the procedure of needle localization
including benefits and alternatives. We discussed the high
likelihood of a successful procedure. We discussed the risks of the
procedure, including infection, bleeding, tissue injury, and
further surgery. Informed, written consent was given.

Using mammographic guidance, sterile technique, 2% lidocaine and
two 7 cm modified Kopans needles, the area of architectural
distortion and clip were localized using a lateral approach.  The
films are marked for Dr. FLAVINHO.

Specimen radiograph was performed at surgery, and confirms the area
of distortion, intact wires, and clip to be present in the tissue
sample.  The specimen is marked for pathology.
IMPRESSION: Needle localization of the right breast as discussed above.  No
apparent complications.

## 2012-09-27 SURGERY — BREAST BIOPSY WITH NEEDLE LOCALIZATION
Anesthesia: General | Site: Breast | Laterality: Right | Wound class: Clean

## 2012-09-27 MED ORDER — OXYCODONE HCL 5 MG PO TABS: 5.0000 mg | ORAL_TABLET | Freq: Once | ORAL | Status: DC | PRN

## 2012-09-27 MED ORDER — FENTANYL CITRATE 0.05 MG/ML IJ SOLN: 50.0000 ug | INTRAMUSCULAR | Status: DC | PRN

## 2012-09-27 MED ORDER — CHLORHEXIDINE GLUCONATE 4 % EX LIQD: 1.0000 "application " | Freq: Once | CUTANEOUS | Status: DC

## 2012-09-27 MED ORDER — FENTANYL CITRATE 0.05 MG/ML IJ SOLN
INTRAMUSCULAR | Status: DC | PRN
  Administered 2012-09-27: 50 ug via INTRAVENOUS

## 2012-09-27 MED ORDER — LIDOCAINE HCL (CARDIAC) 20 MG/ML IV SOLN
INTRAVENOUS | Status: DC | PRN
  Administered 2012-09-27: 50 mg via INTRAVENOUS

## 2012-09-27 MED ORDER — CEFAZOLIN SODIUM-DEXTROSE 2-3 GM-% IV SOLR
2.0000 g | INTRAVENOUS | Status: AC
  Administered 2012-09-27: 2 g via INTRAVENOUS

## 2012-09-27 MED ORDER — MIDAZOLAM HCL 2 MG/2ML IJ SOLN: 1.0000 mg | INTRAMUSCULAR | Status: DC | PRN

## 2012-09-27 MED ORDER — MIDAZOLAM HCL 5 MG/5ML IJ SOLN
INTRAMUSCULAR | Status: DC | PRN
  Administered 2012-09-27: 2 mg via INTRAVENOUS

## 2012-09-27 MED ORDER — MEPERIDINE HCL 25 MG/ML IJ SOLN: 6.2500 mg | INTRAMUSCULAR | Status: DC | PRN

## 2012-09-27 MED ORDER — LACTATED RINGERS IV SOLN
INTRAVENOUS | Status: DC
  Administered 2012-09-27 (×2): via INTRAVENOUS

## 2012-09-27 MED ORDER — HYDROCODONE-ACETAMINOPHEN 5-325 MG PO TABS: 1.0000 | ORAL_TABLET | ORAL | Status: DC | PRN

## 2012-09-27 MED ORDER — PROMETHAZINE HCL 25 MG/ML IJ SOLN: 6.2500 mg | INTRAMUSCULAR | Status: DC | PRN

## 2012-09-27 MED ORDER — OXYCODONE HCL 5 MG/5ML PO SOLN: 5.0000 mg | Freq: Once | ORAL | Status: DC | PRN

## 2012-09-27 MED ORDER — SODIUM BICARBONATE 4 % IV SOLN
INTRAVENOUS | Status: DC | PRN
  Administered 2012-09-27: 10:00:00 via INTRADERMAL

## 2012-09-27 MED ORDER — ONDANSETRON HCL 4 MG/2ML IJ SOLN
INTRAMUSCULAR | Status: DC | PRN
  Administered 2012-09-27: 4 mg via INTRAVENOUS

## 2012-09-27 MED ORDER — PROPOFOL 10 MG/ML IV EMUL
INTRAVENOUS | Status: DC | PRN
  Administered 2012-09-27: 75 ug/kg/min via INTRAVENOUS

## 2012-09-27 MED ORDER — HYDROMORPHONE HCL PF 1 MG/ML IJ SOLN: 0.2500 mg | INTRAMUSCULAR | Status: DC | PRN

## 2012-09-27 SURGICAL SUPPLY — 47 items
APPLICATOR COTTON TIP 6IN STRL (MISCELLANEOUS) IMPLANT
BINDER BREAST LRG (GAUZE/BANDAGES/DRESSINGS) ×2 IMPLANT
BINDER BREAST MEDIUM (GAUZE/BANDAGES/DRESSINGS) IMPLANT
BINDER BREAST XLRG (GAUZE/BANDAGES/DRESSINGS) IMPLANT
BINDER BREAST XXLRG (GAUZE/BANDAGES/DRESSINGS) IMPLANT
BLADE HEX COATED 2.75 (ELECTRODE) ×2 IMPLANT
BLADE SURG 15 STRL LF DISP TIS (BLADE) ×1 IMPLANT
BLADE SURG 15 STRL SS (BLADE) ×1
CANISTER SUCTION 1200CC (MISCELLANEOUS) ×2 IMPLANT
CHLORAPREP W/TINT 26ML (MISCELLANEOUS) ×2 IMPLANT
CLIP TI MEDIUM 6 (CLIP) IMPLANT
CLIP TI WIDE RED SMALL 6 (CLIP) IMPLANT
CLOTH BEACON ORANGE TIMEOUT ST (SAFETY) ×2 IMPLANT
COVER MAYO STAND STRL (DRAPES) ×2 IMPLANT
COVER TABLE BACK 60X90 (DRAPES) ×2 IMPLANT
DECANTER SPIKE VIAL GLASS SM (MISCELLANEOUS) IMPLANT
DERMABOND ADVANCED (GAUZE/BANDAGES/DRESSINGS) ×1
DERMABOND ADVANCED .7 DNX12 (GAUZE/BANDAGES/DRESSINGS) ×1 IMPLANT
DEVICE DUBIN W/COMP PLATE 8390 (MISCELLANEOUS) ×2 IMPLANT
DRAPE LAPAROTOMY TRNSV 102X78 (DRAPE) ×2 IMPLANT
DRAPE SURG 17X23 STRL (DRAPES) ×2 IMPLANT
DRAPE UTILITY XL STRL (DRAPES) ×2 IMPLANT
ELECT REM PT RETURN 9FT ADLT (ELECTROSURGICAL) ×2
ELECTRODE REM PT RTRN 9FT ADLT (ELECTROSURGICAL) ×1 IMPLANT
GLOVE ECLIPSE 6.5 STRL STRAW (GLOVE) ×2 IMPLANT
GLOVE EUDERMIC 7 POWDERFREE (GLOVE) ×2 IMPLANT
GLOVE EXAM NITRILE EXT CUFF MD (GLOVE) ×2 IMPLANT
GOWN PREVENTION PLUS XLARGE (GOWN DISPOSABLE) ×4 IMPLANT
KIT MARKER MARGIN INK (KITS) ×2 IMPLANT
NEEDLE HYPO 25X1 1.5 SAFETY (NEEDLE) ×2 IMPLANT
NS IRRIG 1000ML POUR BTL (IV SOLUTION) ×2 IMPLANT
PACK BASIN DAY SURGERY FS (CUSTOM PROCEDURE TRAY) ×2 IMPLANT
PENCIL BUTTON HOLSTER BLD 10FT (ELECTRODE) ×2 IMPLANT
SHEET MEDIUM DRAPE 40X70 STRL (DRAPES) ×2 IMPLANT
SLEEVE SCD COMPRESS KNEE MED (MISCELLANEOUS) ×2 IMPLANT
SPONGE GAUZE 4X4 12PLY (GAUZE/BANDAGES/DRESSINGS) IMPLANT
SPONGE INTESTINAL PEANUT (DISPOSABLE) IMPLANT
SPONGE LAP 4X18 X RAY DECT (DISPOSABLE) ×2 IMPLANT
STAPLER VISISTAT 35W (STAPLE) IMPLANT
SUT MNCRL AB 4-0 PS2 18 (SUTURE) ×2 IMPLANT
SUT SILK 0 TIES 10X30 (SUTURE) IMPLANT
SUT VICRYL 3-0 CR8 SH (SUTURE) ×2 IMPLANT
SYR CONTROL 10ML LL (SYRINGE) ×2 IMPLANT
TOWEL OR NON WOVEN STRL DISP B (DISPOSABLE) ×2 IMPLANT
TUBE CONNECTING 20X1/4 (TUBING) ×2 IMPLANT
WATER STERILE IRR 1000ML POUR (IV SOLUTION) IMPLANT
YANKAUER SUCT BULB TIP NO VENT (SUCTIONS) ×2 IMPLANT

## 2012-09-27 NOTE — Transfer of Care (Signed)
Immediate Anesthesia Transfer of Care Note  Patient: Kristina Greene  Procedure(s) Performed: Procedure(s) (LRB) with comments: BREAST BIOPSY WITH NEEDLE LOCALIZATION (Right)  Patient Location: PACU  Anesthesia Type:MAC  Level of Consciousness: awake, alert  and patient cooperative  Airway & Oxygen Therapy: Patient Spontanous Breathing  Post-op Assessment: Report given to PACU RN and Post -op Vital signs reviewed and stable  Post vital signs: Reviewed and stable  Complications: No apparent anesthesia complications

## 2012-09-27 NOTE — Op Note (Signed)
Kristina Greene  1961-01-19  [id number]  09/27/2012   Preoperative diagnosis: Right breast mass - radial scar on NCB  Postoperative diagnosis: Same  Procedure: Wire localized removal of right breast mass  Surgeon: Currie Paris, MD, FACS  Anesthesia: MAC  Clinical History and Indications: this patient presents for a guidewire localized excision of a right breast mass that was in the mid-lateral position and was radial scar on prior NCB.  Description of procedure: The patient was seen in the holding area and the plans for the procedure reviewed. The right breast was marked as the operative side. The wire localizing films were reviewed.  The patient was taken to the operating room and after satisfactory general anesthesia had been obtained the right breast was prepped and draped and the timeout was performed.  The incision was made over the presumed area of the mass. Two guidewires entered to bracket the area and travelled from lateral to medial. A curvilinear incision was madeSkin flaps were raised and using cautery the area was completely excised. I used two Allis clamps to grasp tissue above and below the two wires and excised a cylinder of tissue, trying to stay well around both wires. Bleeders were controlled with either cautery or sutures as needed.  After achieving hemostasis, the incision was closed with 3-0 Vicryl, 4-0 Monocryl subcuticular, and Dermabond.  Specimen mammogram showed the clip and wires in the specimen  The patient tolerated the procedure well. There were no operative complications. All counts were correct.   EBL: minimal  Currie Paris, MD, FACS 09/27/2012 10:13 AM

## 2012-09-27 NOTE — Anesthesia Preprocedure Evaluation (Signed)
Anesthesia Evaluation  Patient identified by MRN, date of birth, ID band Patient awake    Reviewed: Allergy & Precautions, H&P , NPO status , Patient's Chart, lab work & pertinent test results  History of Anesthesia Complications Negative for: history of anesthetic complications  Airway Mallampati: II  Neck ROM: full    Dental No notable dental hx. (+) Teeth Intact   Pulmonary neg pulmonary ROS,  breath sounds clear to auscultation  Pulmonary exam normal       Cardiovascular negative cardio ROS  IRhythm:regular Rate:Normal     Neuro/Psych negative neurological ROS  negative psych ROS   GI/Hepatic negative GI ROS, Neg liver ROS, GERD-  ,  Endo/Other  Hypothyroidism   Renal/GU negative Renal ROS  negative genitourinary   Musculoskeletal   Abdominal   Peds  Hematology negative hematology ROS (+)   Anesthesia Other Findings   Reproductive/Obstetrics negative OB ROS                           Anesthesia Physical Anesthesia Plan  ASA: I  Anesthesia Plan:    Post-op Pain Management:    Induction:   Airway Management Planned:   Additional Equipment:   Intra-op Plan:   Post-operative Plan:   Informed Consent: I have reviewed the patients History and Physical, chart, labs and discussed the procedure including the risks, benefits and alternatives for the proposed anesthesia with the patient or authorized representative who has indicated his/her understanding and acceptance.     Plan Discussed with: CRNA and Surgeon  Anesthesia Plan Comments:         Anesthesia Quick Evaluation

## 2012-09-27 NOTE — H&P (View-Only) (Signed)
Patient ID: Kristina Greene, female   DOB: 12/09/1960, 51 y.o.   MRN: [id number]  Chief Complaint  Patient presents with  . Follow-up    reck rt br mass - discuss Sx    HPI Kristina Greene is a 51 y.o. female.  I saw this patient about a year ago with an indeterminate area in the breast that had been biopsied and showed fibrosis possibly radial scar. Excisional biopsy had been recommended but declined by the patient. This area has been followed up there still an abnormality present although not significantly different. However the patient now wishes to have this surgically excised. She's had no further symptoms or problems. HPI  Past Medical History  Diagnosis Date  . Hyperlipidemia   . Thyroid disease   . History of thyroid cancer     Past Surgical History  Procedure Date  . Thyroidectomy 2009  . Appendectomy 1986    History reviewed. No pertinent family history.  Social History History  Substance Use Topics  . Smoking status: Never Smoker   . Smokeless tobacco: Not on file  . Alcohol Use: No    No Known Allergies  Current Outpatient Prescriptions  Medication Sig Dispense Refill  . SYNTHROID 88 MCG tablet       . UNABLE TO FIND Med Name: per patient levothrine 5 mg        Review of Systems Review of Systems Unchanged from last year Blood pressure 116/70, pulse 76, temperature 97.2 F (36.2 C), temperature source Temporal, resp. rate 16, height 5\' 4"  (1.626 m), weight 156 lb 9.6 oz (71.033 kg), last menstrual period 08/16/2012.  Physical Exam Physical Exam VITAL SIGNS: BP 116/70  Pulse 76  Temp 97.2 F (36.2 C) (Temporal)  Resp 16  Ht 5\' 4"  (1.626 m)  Wt 156 lb 9.6 oz (71.033 kg)  BMI 26.88 kg/m2  LMP 08/16/2012 GENERAL:  The patient is alert, oriented, and generally healthy-appearing, NAD. Mood and affect are normal.  HEENT:  The head is normocephalic, the eyes nonicteric, the pupils were round regular and equal. EOMs are normal. Pharynx normal. Dentition  good.  NECK:  The neck is supple and there are no masses or thyromegaly.  LUNGS: Normal respirations and clear to auscultation.  HEART: Regular rhythm, with no murmurs rubs or gallops. Pulses are intact carotid dorsalis pedis and posterior tibial. No significant varicosities are noted.  BREASTS: The breasts Are symmetric and without dominant mass. No abnormalities are noted.  LYMPHATICS: No axillary or supraclavicular adenopathy is noted. ABDOMEN: Soft, flat, and nontender. No masses or organomegaly is noted. No hernias are noted. Bowel sounds are normal.  EXTREMITIES:  Good range of motion, no edema. Data Reviewed I have reviewed my old notes as well as radiology reports from last year and this year  Current mammogram: IMPRESSION:  Persistent distortion within the upper-outer quadrant of the right  breast. I have discussed the findings with the patient and  recommend surgical excision. She wants to proceed with surgical  excision and repeat consultation with Dr. Jamey Ripa has been arranged  for 09/07/2012.  RECOMMENDATION:  Right breast surgical excision following needle localization of the  area of distortion.  I have discussed the findings and recommendations with the patient.  Results were also provided in writing at the conclusion of the  visit.  BI-RADS CATEGORY 4: Suspicious abnormality - biopsy should be  considered.  Original Report Authenticated By: Rolla Plate, M.D   Assessment    Mammographic abnormality of uncertain significance  Plan    I discussed the alternatives with the patient. A wire localized excision is possible. Given the stability of this area is now more likely to be benign but not, but still indeterminate. She wishes to proceed. I have discussed the indications for the lumpectomy and described the procedure. She understand that the chance of removal of the abnormal area is very good, but that occasionally we are unable to locate it and  may have to do a second procedure. We also discussed the possibility of a second procedure to get additional tissue. Risks of surgery such as bleeding and infection have also been explained, as well as the implications of not doing the surgery. She understands and wishes to proceed.        Seung Nidiffer J 09/07/2012, 1:55 PM

## 2012-09-27 NOTE — Interval H&P Note (Signed)
History and Physical Interval Note:  09/27/2012 9:09 AM  Kristina Greene  has presented today for surgery, with the diagnosis of right breast mass  The various methods of treatment have been discussed with the patient and family. After consideration of risks, benefits and other options for treatment, the patient has consented to  Procedure(s) (LRB) with comments: BREAST BIOPSY WITH NEEDLE LOCALIZATION (Right) as a surgical intervention .  [name] history has been reviewed, patient examined, no change in status, stable for surgery.  I have reviewed the patient's chart and labs.  Questions were answered to the patient's satisfaction.     Jheri Mitter J

## 2012-09-27 NOTE — Anesthesia Postprocedure Evaluation (Signed)
  Anesthesia Post-op Note  Patient: Kristina Greene  Procedure(s) Performed: Procedure(s) (LRB) with comments: BREAST BIOPSY WITH NEEDLE LOCALIZATION (Right)  Patient Location: PACU  Anesthesia Type:MAC  Level of Consciousness: awake  Airway and Oxygen Therapy: Patient Spontanous Breathing  Post-op Pain: mild  Post-op Assessment: Post-op Vital signs reviewed  Post-op Vital Signs: stable  Complications: No apparent anesthesia complications

## 2012-09-28 ENCOUNTER — Encounter (HOSPITAL_BASED_OUTPATIENT_CLINIC_OR_DEPARTMENT_OTHER): Payer: Self-pay | Admitting: Surgery

## 2012-09-29 ENCOUNTER — Telehealth (INDEPENDENT_AMBULATORY_CARE_PROVIDER_SITE_OTHER): Payer: Self-pay | Admitting: General Surgery

## 2012-09-29 NOTE — Telephone Encounter (Signed)
Message copied by Liliana Cline on Wed Sep 29, 2012 11:58 AM ------      Message from: Currie Paris      Created: Wed Sep 29, 2012 11:52 AM       Tell her path is benign and as expected

## 2012-09-29 NOTE — Telephone Encounter (Signed)
Patient made aware of path results. Will follow up at appt and call with any questions prior.  

## 2012-10-08 ENCOUNTER — Ambulatory Visit (INDEPENDENT_AMBULATORY_CARE_PROVIDER_SITE_OTHER): Payer: BC Managed Care – PPO | Admitting: Surgery

## 2012-10-08 ENCOUNTER — Encounter (INDEPENDENT_AMBULATORY_CARE_PROVIDER_SITE_OTHER): Payer: Self-pay | Admitting: Surgery

## 2012-10-08 VITALS — BP 110/70 | HR 80 | Temp 98.0°F | Resp 17 | Ht 64.0 in | Wt 160.0 lb

## 2012-10-08 DIAGNOSIS — N62 Hypertrophy of breast: Secondary | ICD-10-CM

## 2012-10-08 DIAGNOSIS — N6099 Unspecified benign mammary dysplasia of unspecified breast: Secondary | ICD-10-CM

## 2012-10-08 DIAGNOSIS — Z09 Encounter for follow-up examination after completed treatment for conditions other than malignant neoplasm: Secondary | ICD-10-CM

## 2012-10-08 NOTE — Addendum Note (Signed)
Addended byLiliana Cline on: 10/08/2012 12:37 PM   Modules accepted: Orders

## 2012-10-08 NOTE — Patient Instructions (Signed)
We will place a referral for the high risk clinic for breast cancer. I will see you back on an as-needed basis.

## 2012-10-08 NOTE — Progress Notes (Signed)
NAME: Kristina Greene                                            DOB: [birth year] DATE: 10/08/2012                                                  MRN: [id number]  CC: Post op   HPI: This patient comes in for post op follow-up .Sheunderwent excisional biopsy, right breast  on 12/09-/13  . She feels that she is doing well.  PE:  VITAL SIGNS: BP 110/70  Pulse 80  Temp 98 F (36.7 C)  Resp 17  Ht 5\' 4"  (1.626 m)  Wt 160 lb (72.576 kg)  BMI 27.46 kg/m2  LMP 09/10/2012  General: The patient appears to be healthy, NAD Breasts: The incision is healing nicely. There is no evidence of infection.  DATA REVIEWED:Path: Diagnosis Breast, lumpectomy, Right - COMPLEX SCLEROSING LESION/RADIAL SCAR, SEE COMMENT. - LOBULAR NEOPLASIA (ATYPICAL HYPERPLASIA). - NEGATIVE FOR INVASIVE MALIGNANCY. Microscopic Comment The surgical resection margin(s) of the specimen were inked and microscopically evaluated. In some of the slide sections, there is a complex sclerosing lesion/radial scar associated with fibrocystic change and usual ductal hyperplasia. 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 Additional non-neoplastic findings include sclerosing adenosis and abundant microcalcifications in benign ducts and lobules. Immunostains for smooth muscle myosin heavy chain, p63 and calponin confirm the presence of the myoepithelial layer. (CR:kh 09/28/12) [place] RUND DO Pathologist, Electronic Signature (Case signed 09/29/2012)  IMPRESSION: The patient is doing well S/P wire localized left breast biopsy.    PLAN: I'll see back on an as-needed basis. With her significant lobular neoplasia we will make a referral to the breast cancer high-risk evaluation clinic. I discussed that with the patient and given her a copy of her pathology report

## 2012-12-31 ENCOUNTER — Telehealth (INDEPENDENT_AMBULATORY_CARE_PROVIDER_SITE_OTHER): Payer: Self-pay | Admitting: General Surgery

## 2012-12-31 NOTE — Telephone Encounter (Signed)
Message sent to [facility] to find out who was to contact patient with appt.

## 2012-12-31 NOTE — Telephone Encounter (Signed)
Message copied by Liliana Cline on Fri Dec 31, 2012  1:52 PM ------      Message from: Marnette Burgess      Created: Fri Dec 31, 2012 12:19 PM      Contact: [phone]       Pt called to make an appointment and mentioned that at her last visit Dr. Jamey Ripa told her and it also says on her check out sheet she was to be referred to a high risk clinic for cancer, she never heard from anyone, please call. ------

## 2013-01-05 ENCOUNTER — Telehealth (INDEPENDENT_AMBULATORY_CARE_PROVIDER_SITE_OTHER): Payer: Self-pay | Admitting: General Surgery

## 2013-01-05 NOTE — Telephone Encounter (Signed)
Message copied by Liliana Cline on Wed Jan 05, 2013  4:41 PM ------      Message from: April Manson      Created: Wed Jan 05, 2013 10:02 AM       Hi Lesly Rubenstein,            I am.  [facility] you are as well.            That would be [employer]. [phone]            Thanks,      Misty Stanley                  ----- Message -----         From: Liliana Cline, CMA         Sent: 12/31/2012   1:51 PM           To: April Manson            Hi there. Hope you are doing well. I had a quick question- who sets up appts for the high risk breast clinic? We referred this patient back in December and I got a message from her today that she hasn't heard anything. I wasn't sure where to start? Any help would be appreciated.             Thanks,       Lesly Rubenstein       ------

## 2013-01-05 NOTE — Telephone Encounter (Signed)
Tried to call Kristina Greene and number provided busy. I will call again tomorrow.

## 2013-01-06 NOTE — Telephone Encounter (Signed)
Spoke with Tiffany, she states she will touch base with the patient about an appt today.

## 2013-01-14 ENCOUNTER — Telehealth: Payer: Self-pay | Admitting: Oncology

## 2013-01-14 NOTE — Telephone Encounter (Signed)
CALLED PT TO SCHEDULE APPT NO ABLE TO LEAVE MESSAGE ON MAIN NUMBER IN SYSTEM.

## 2013-01-18 ENCOUNTER — Ambulatory Visit (INDEPENDENT_AMBULATORY_CARE_PROVIDER_SITE_OTHER): Payer: BC Managed Care – PPO | Admitting: Surgery

## 2013-01-18 ENCOUNTER — Encounter (INDEPENDENT_AMBULATORY_CARE_PROVIDER_SITE_OTHER): Payer: Self-pay | Admitting: Surgery

## 2013-01-18 VITALS — BP 117/72 | HR 60 | Temp 97.7°F | Resp 12 | Ht 66.0 in | Wt 163.6 lb

## 2013-01-18 DIAGNOSIS — N644 Mastodynia: Secondary | ICD-10-CM

## 2013-01-18 NOTE — Patient Instructions (Signed)
See if taking 800 units of vitamin E daily for about a month helps with your discomfort in your right breast

## 2013-01-18 NOTE — Progress Notes (Signed)
NAME: Kristina Greene       DOB: 11/21/1960           DATE: 01/18/2013       ZOX:[id number]  CC:  Chief Complaint  Patient presents with  . Follow-up    HPI: this patient's continue to have some right breast discomfort. She underwent a right wire localized breast biopsy about 4 months ago for what proved to be area of radial scar and sclerosis. Some fibrocystic changes were also noted. She is having some pain in the right breast laterally and also a little bit medially. She thinks her right breast feels a little bit swollen but persisting to have an enlarged kidney. She's not had any nipple discharge. She is not having any symptoms on the left side.  We recommended a high-risk breast clinic evaluation since he did have some atypical hyperplasia but that has not yet occurred. Given her the phone number to make a direct contact with him.  EXAM: Vital signs: BP 117/72  Pulse 60  Temp(Src) 97.7 F (36.5 C) (Temporal)  Resp 12  Ht 5\' 6"  (1.676 m)  Wt 163 lb 9.6 oz (74.208 kg)  BMI 26.42 kg/m2  General: Patient alert, oriented, NAD  Breasts: The breasts are basically symmetric in appearance. There is a somewhat curvilinear scar in the right breast lower outer quadrant. It is somewhat red still it has not faded yet. The breast is otherwise completely normal to examination.there is no thickening at the surgical site or other abnormality noted. The nipple and areolar complex looked normal. IMP: I think she has some fibrocystic changes and mastodynia. There is no evidence of infection, mass, or other abnormalities to account for her symptoms.  PLAN: I recommended that she try 800 units a day vitamin E to see if this gives her any help. I also strongly recommended that she make her followup with the High-Risk breast clinic.  Kylian Loh J 01/18/2013

## 2013-01-20 ENCOUNTER — Telehealth: Payer: Self-pay | Admitting: Oncology

## 2013-01-20 NOTE — Telephone Encounter (Signed)
LVOM FOR PT TO RETURN CALL.  °

## 2013-01-20 NOTE — Telephone Encounter (Signed)
S/W PT IN RE HIGH RISK APPT 04/22 @ 1:30.  WELCOME PACKET MAILED.

## 2013-01-20 NOTE — Telephone Encounter (Signed)
C/D 01/20/13 for appt 02/08/13

## 2013-02-07 ENCOUNTER — Telehealth: Payer: Self-pay | Admitting: Oncology

## 2013-02-07 ENCOUNTER — Other Ambulatory Visit: Payer: Self-pay | Admitting: Medical Oncology

## 2013-02-08 ENCOUNTER — Encounter: Payer: BC Managed Care – PPO | Admitting: Oncology

## 2013-06-21 HISTORY — PX: METATARSAL OSTEOTOMY: SHX1641

## 2013-06-21 HISTORY — PX: OTHER SURGICAL HISTORY: SHX169

## 2013-07-26 ENCOUNTER — Other Ambulatory Visit: Payer: Self-pay

## 2013-07-26 DIAGNOSIS — Z9889 Other specified postprocedural states: Secondary | ICD-10-CM

## 2013-07-26 DIAGNOSIS — Z1231 Encounter for screening mammogram for malignant neoplasm of breast: Secondary | ICD-10-CM

## 2013-07-28 ENCOUNTER — Ambulatory Visit (INDEPENDENT_AMBULATORY_CARE_PROVIDER_SITE_OTHER): Payer: PRIVATE HEALTH INSURANCE

## 2013-07-28 ENCOUNTER — Ambulatory Visit (INDEPENDENT_AMBULATORY_CARE_PROVIDER_SITE_OTHER): Payer: PRIVATE HEALTH INSURANCE | Admitting: Podiatry

## 2013-07-28 ENCOUNTER — Encounter: Payer: Self-pay | Admitting: Podiatry

## 2013-07-28 VITALS — BP 111/72 | HR 62 | Resp 16

## 2013-07-28 DIAGNOSIS — Z9889 Other specified postprocedural states: Secondary | ICD-10-CM

## 2013-07-28 DIAGNOSIS — R609 Edema, unspecified: Secondary | ICD-10-CM

## 2013-07-28 DIAGNOSIS — M2010 Hallux valgus (acquired), unspecified foot: Secondary | ICD-10-CM

## 2013-07-28 NOTE — Patient Instructions (Signed)
Begin tennis shoes and continue ibuprophen

## 2013-07-28 NOTE — Progress Notes (Signed)
Subjective:     Patient ID: Kristina Greene, female   DOB: [birth year], 52 y.o.   MRN: [id number]  HPI patient states her left foot is feeling good but she still is having swelling. Continues to wear her surgical shoe. Is able to ambulate more frequently at this time. I've weeks after multiple foot surgery left.   Review of Systems  All other systems reviewed and are negative.       Objective:   Physical Exam  Nursing note and vitals reviewed. Musculoskeletal: Normal range of motion.  Neurological: She is alert.  Skin: Skin is warm.   negative Homans sign noted. Patient's left foot is healing well the incision sites are all healing at this time and the structural line it looks good. Range of motion of the first MPJ is good. Restrictions noted.    Assessment:     He well post multiple foot surgery forefoot left.    Plan:     Reviewed H&P. Encouraged to begin wearing tennis shoes at the current time and advised on increasing movement dispensed anklet with instructions on compression.

## 2013-08-09 ENCOUNTER — Telehealth: Payer: Self-pay | Admitting: *Deleted

## 2013-08-09 NOTE — Telephone Encounter (Signed)
Pt complains of having small drainage at end of bunion surgical site after removing elastic sock, has been using an antibiotic ointment and telfa dressing.  I encouraged pt to continue the telfa over the site while in the elastic sock and apply antibiotic ointment at site and bandaid at night.  Pt states all of the rest of the foot looks good.

## 2013-08-17 ENCOUNTER — Ambulatory Visit
Admission: RE | Admit: 2013-08-17 | Discharge: 2013-08-17 | Disposition: A | Discharge status: Home / Self Care | Payer: BC Managed Care – PPO | Source: Ambulatory Visit

## 2013-08-17 ENCOUNTER — Ambulatory Visit: Payer: PRIVATE HEALTH INSURANCE | Admitting: Podiatry

## 2013-08-17 ENCOUNTER — Ambulatory Visit (INDEPENDENT_AMBULATORY_CARE_PROVIDER_SITE_OTHER): Payer: PRIVATE HEALTH INSURANCE

## 2013-08-17 ENCOUNTER — Other Ambulatory Visit: Payer: Self-pay

## 2013-08-17 DIAGNOSIS — Z9889 Other specified postprocedural states: Secondary | ICD-10-CM

## 2013-08-17 DIAGNOSIS — Z1231 Encounter for screening mammogram for malignant neoplasm of breast: Secondary | ICD-10-CM

## 2013-08-17 DIAGNOSIS — B351 Tinea unguium: Secondary | ICD-10-CM

## 2013-08-17 DIAGNOSIS — N644 Mastodynia: Secondary | ICD-10-CM

## 2013-08-17 IMAGING — MG MM SCREEN MAMMOGRAM BILATERAL
4 series · 4 of 4 positions shown · non-contrast
Comparison: Previous examinations.

CLINICAL DATA: Post surgical excision of a complex sclerosing
lesion located within the lateral right breast in [DATE].
This demonstrated the complex sclerosing lesion and also atypical
lobular hyperplasia. Patient has mild focal tenderness and
questionable nodularity within the lateral right breast located
several cm lateral to her incision.

EXAM:
DIGITAL DIAGNOSTIC  BILATERAL MAMMOGRAM WITH CAD
ULTRASOUND right BREAST

[R CC]
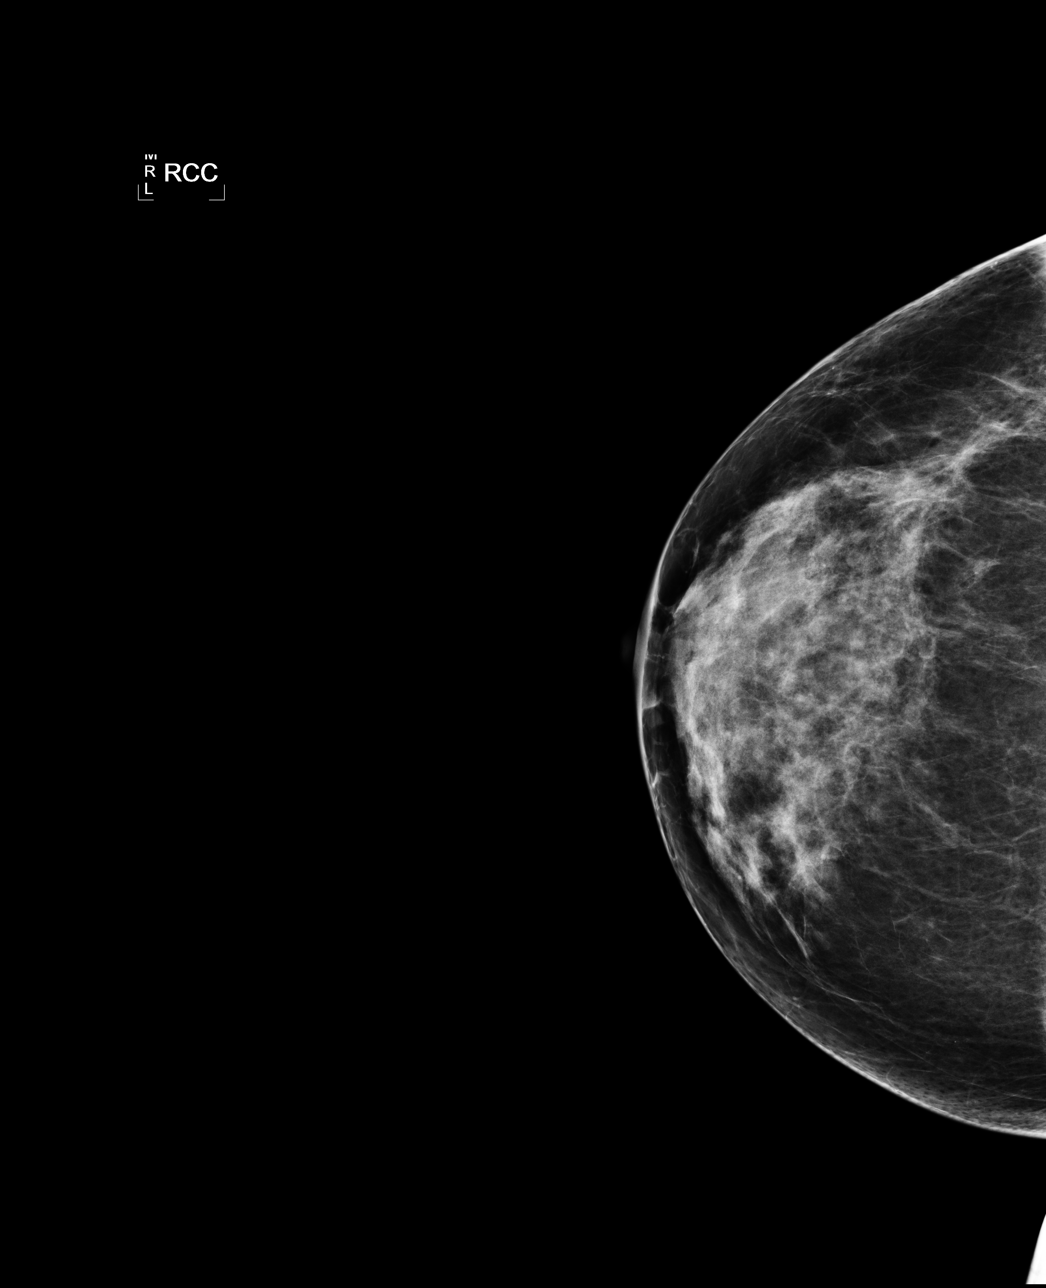

[L CC]
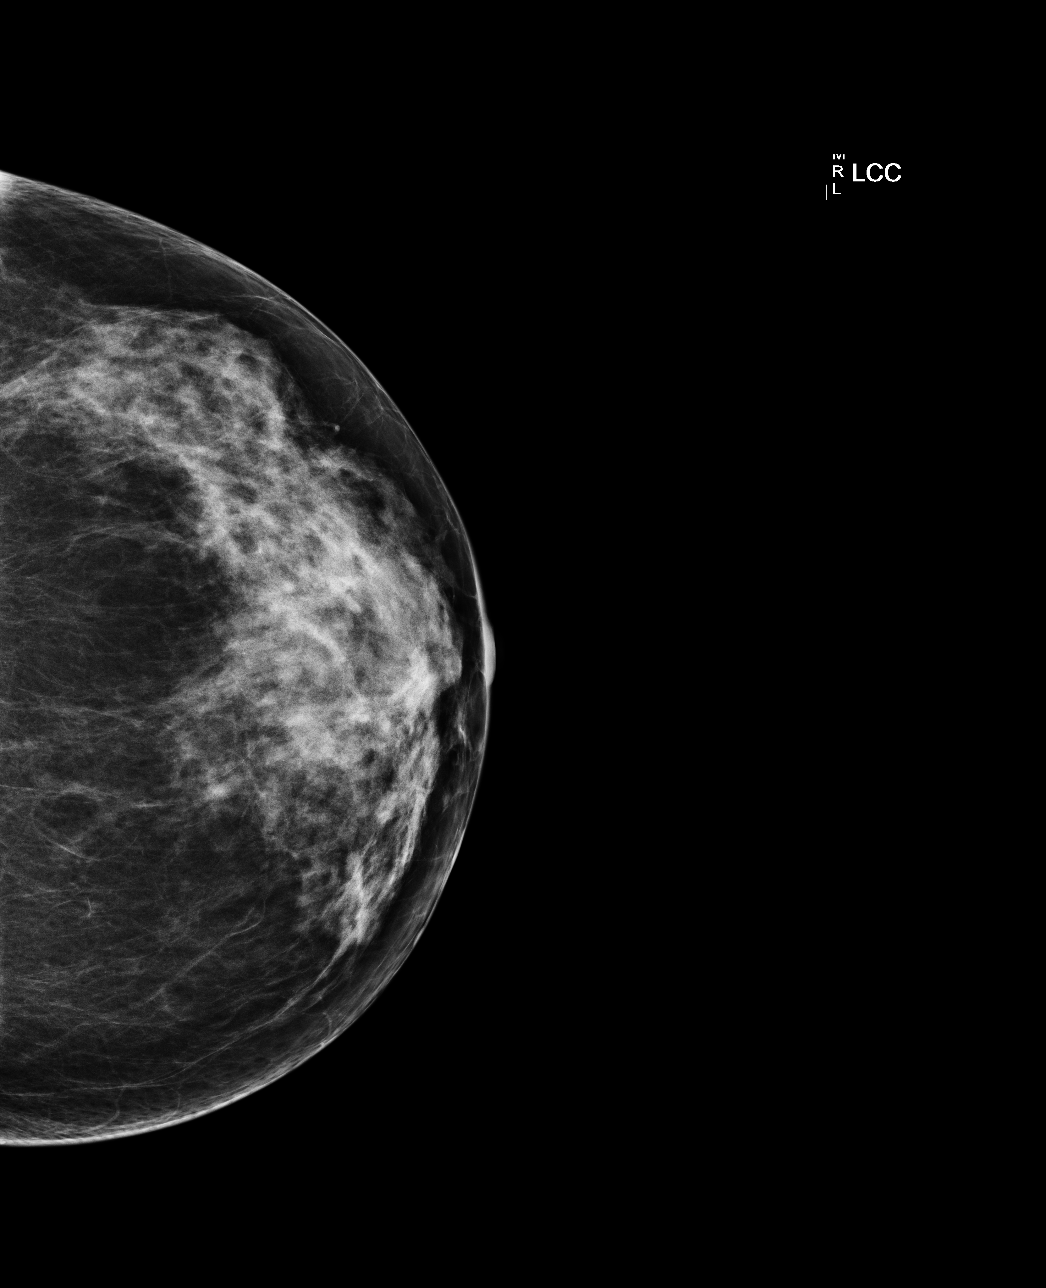

[L MLO]
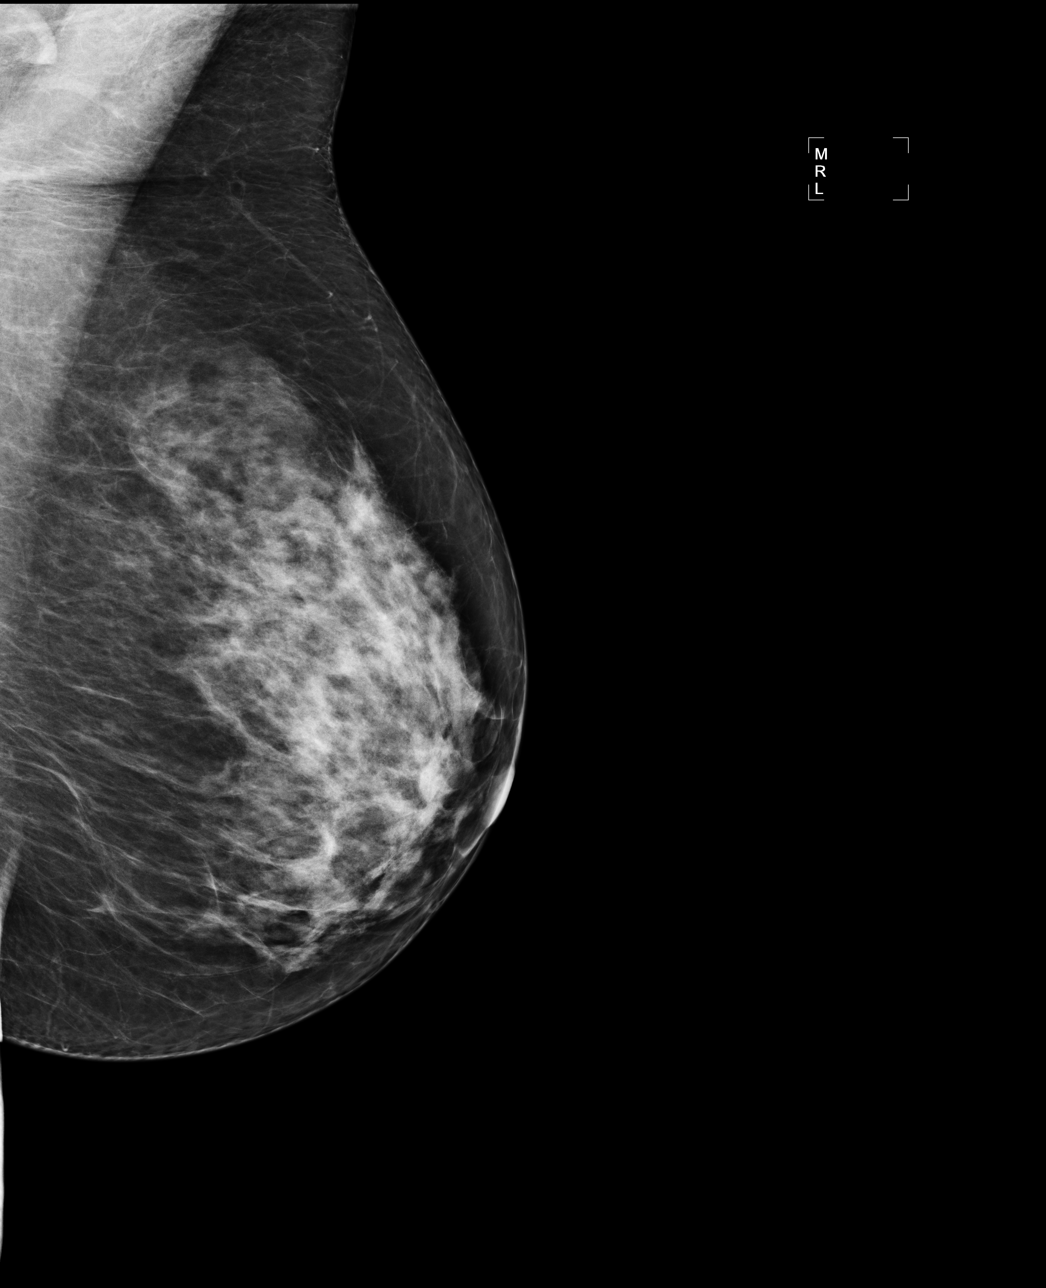

[R MLO]
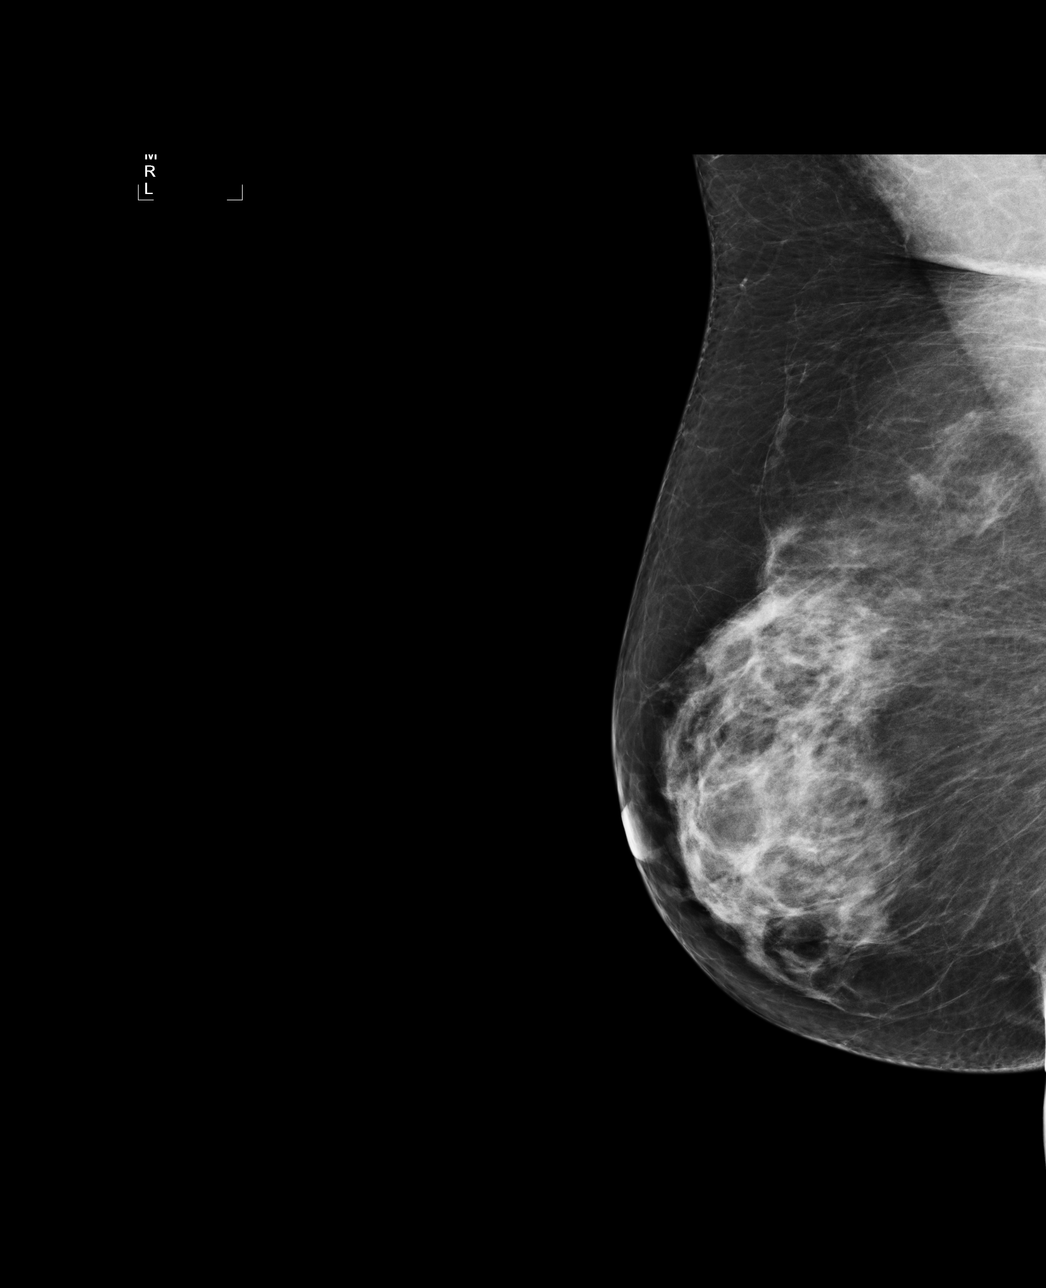

[4 of 4 positions shown; findings below may reference images not displayed]

ACR Breast Density Category c: The breasts are heterogeneously
dense, which may obscure small masses.
FINDINGS: There are mild scarring changes located laterally within the right
breast in the area the patient's surgical excision. There is no
mass, worrisome distortion, or worrisome calcification within either
breast.

Mammographic images were processed with CAD.

On physical exam,there is a well-healed incision located within the
lateral right breast. There is no discrete palpable mass..

Ultrasound is performed, showing no mass, worrisome distortion, or
worrisome shadowing.
IMPRESSION: No findings worrisome for malignancy. Recommend referral to the high
risk clinic.

RECOMMENDATION:
Bilateral screening mammography in 1 year. Recommend referral to the
high risk clinic.

I have discussed the findings and recommendations with the patient.
Results were also provided in writing at the conclusion of the
visit.

BI-RADS CATEGORY  2: Benign Finding(s)

## 2013-08-17 NOTE — Progress Notes (Signed)
Pt presents with swollen, red and drainage at distal bunionectomy suture line.  Left foot.

## 2013-08-18 NOTE — Progress Notes (Signed)
Subjective:     Patient ID: Kristina Greene, female   DOB: 1961-09-13, 52 y.o.   MRN: [id number]  HPI patient states that she had some drainage on her incision site first metatarsal left she wanted checked and that also she is moving out of town and she wanted to get her foot checked 1 more time. Patient is a approximate 6 week history of forefoot reconstruction left   Review of Systems  All other systems reviewed and are negative.       Objective:   Physical Exam  Nursing note and vitals reviewed. Constitutional: She is oriented to person, place, and time.  Cardiovascular: Intact distal pulses.   Neurological: She is oriented to person, place, and time.   patient's left foot is healing well with a small amount of drainage on the distal first metatarsal incision site where the stitch is resorbing. No indications of erythema surrounding this area or proximal edema erythema or lymph node distention. Incision sites are also healing well and other areas and the structure the foot looks good with good range of motion first MPJ 30 dorsiflexion 20 plantar flexion with no pain no crepitus in the joint.     Assessment:     Doing well postoperatively from extensive forefoot surgery    Plan:     X-rays dated left reviewed and advice on continued compression and gradual return to saw shoe given to patient. Encouraged to call or e-mail if any questions should arise during the remaining postoperative.

## 2013-09-02 ENCOUNTER — Telehealth: Payer: Self-pay | Admitting: *Deleted

## 2013-09-02 NOTE — Telephone Encounter (Signed)
Pt states that she would like to schedule surgery in November.  Surgery date 09/13/2013, pt was referred to scheduler to set up consultation appt.

## 2013-09-05 ENCOUNTER — Encounter: Payer: Self-pay | Admitting: Podiatry

## 2013-09-05 ENCOUNTER — Ambulatory Visit (INDEPENDENT_AMBULATORY_CARE_PROVIDER_SITE_OTHER): Payer: PRIVATE HEALTH INSURANCE | Admitting: Podiatry

## 2013-09-05 VITALS — BP 106/64 | HR 68 | Resp 16 | Ht 64.0 in | Wt 150.0 lb

## 2013-09-05 DIAGNOSIS — M2040 Other hammer toe(s) (acquired), unspecified foot: Secondary | ICD-10-CM

## 2013-09-05 DIAGNOSIS — M2010 Hallux valgus (acquired), unspecified foot: Secondary | ICD-10-CM

## 2013-09-05 NOTE — Progress Notes (Signed)
Subjective:     Patient ID: Kristina Greene, female   DOB: 04/25/1961, 52 y.o.   MRN: [id number]  HPI patient presents stating I have decided to stay in town and have my right foot fixed. Patient had the left foot fixed by a knee approximately 2 months ago and is doing very well she has severe forefoot derangement of the right foot   Review of Systems  All other systems reviewed and are negative.       Objective:   Physical Exam  Nursing note and vitals reviewed. Constitutional: She is oriented to person, place, and time. She appears well-nourished.  Cardiovascular: Intact distal pulses.   Musculoskeletal: Normal range of motion.  Neurological: She is oriented to person, place, and time.  Skin: Skin is warm.   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 Patient is allowed to read a consent form for surgery and I reviewed Austin bunionectomy right foot metatarsal osteotomy fifth right foot hammertoe fusion with internal pain digit 2 right distal arthroplasty digits 34 right and proximal arthroplasty digit 5 right. I reviewed all complications associated with surgery and the fact that total recovery. We'll take 6 months to one year. Patient is motivated for surgery and wants to have this done and is scheduled for outpatient surgery in the next 10 days

## 2013-09-13 ENCOUNTER — Encounter: Payer: Self-pay | Admitting: Podiatry

## 2013-09-13 DIAGNOSIS — M2010 Hallux valgus (acquired), unspecified foot: Secondary | ICD-10-CM

## 2013-09-13 DIAGNOSIS — M21619 Bunion of unspecified foot: Secondary | ICD-10-CM

## 2013-09-13 DIAGNOSIS — M2040 Other hammer toe(s) (acquired), unspecified foot: Secondary | ICD-10-CM

## 2013-09-13 HISTORY — PX: BUNIONECTOMY: SHX129

## 2013-09-13 HISTORY — PX: HAMMER TOE SURGERY: SHX385

## 2013-09-19 ENCOUNTER — Encounter: Payer: Self-pay | Admitting: Podiatry

## 2013-09-19 ENCOUNTER — Ambulatory Visit (INDEPENDENT_AMBULATORY_CARE_PROVIDER_SITE_OTHER): Payer: PRIVATE HEALTH INSURANCE

## 2013-09-19 ENCOUNTER — Ambulatory Visit (INDEPENDENT_AMBULATORY_CARE_PROVIDER_SITE_OTHER): Payer: PRIVATE HEALTH INSURANCE | Admitting: Podiatry

## 2013-09-19 VITALS — BP 118/71 | HR 73 | Resp 16

## 2013-09-19 DIAGNOSIS — Z9889 Other specified postprocedural states: Secondary | ICD-10-CM

## 2013-09-19 DIAGNOSIS — M21611 Bunion of right foot: Secondary | ICD-10-CM

## 2013-09-19 DIAGNOSIS — M21619 Bunion of unspecified foot: Secondary | ICD-10-CM

## 2013-09-19 DIAGNOSIS — M2010 Hallux valgus (acquired), unspecified foot: Secondary | ICD-10-CM

## 2013-09-19 DIAGNOSIS — M2040 Other hammer toe(s) (acquired), unspecified foot: Secondary | ICD-10-CM

## 2013-09-20 NOTE — Progress Notes (Signed)
Subjective:     Patient ID: Kristina Greene, female   DOB: [birth year], 52 y.o.   MRN: [id number]  HPI patient states my right foot has been hurting me quite a bit but feels better the last few days. 7 days after extensive forefoot surgery right   Review of Systems  All other systems reviewed and are negative.       Objective:   Physical Exam  Nursing note and vitals reviewed. Constitutional: She is oriented to person, place, and time.  Cardiovascular: Intact distal pulses.   Musculoskeletal: Normal range of motion.  Neurological: She is oriented to person, place, and time.  Skin: Skin is warm.   neurovascular status is intact with negative Homans sign noted. Patient is found to have well-healing surgical site right with incisions intact and good alignment noted with good correction of the digits and mild edema consistent with this. Postop with no other issues noted     Assessment:     Healing well from extensive forefoot surgery right    Plan:     Reviewed x-rays and reapplied sterile dressing and advised on continued complete immobilization of the area with surgical shoe dispensed for home usage reappoint 2 weeks for suture removal and less needs to be seen earlier

## 2013-10-03 ENCOUNTER — Encounter: Payer: PRIVATE HEALTH INSURANCE | Admitting: Podiatry

## 2013-10-06 ENCOUNTER — Ambulatory Visit (INDEPENDENT_AMBULATORY_CARE_PROVIDER_SITE_OTHER): Payer: PRIVATE HEALTH INSURANCE

## 2013-10-06 ENCOUNTER — Ambulatory Visit: Payer: PRIVATE HEALTH INSURANCE | Admitting: Podiatry

## 2013-10-06 VITALS — BP 115/70 | HR 66 | Resp 16 | Ht 64.0 in | Wt 156.0 lb

## 2013-10-06 DIAGNOSIS — R52 Pain, unspecified: Secondary | ICD-10-CM

## 2013-10-06 DIAGNOSIS — R609 Edema, unspecified: Secondary | ICD-10-CM

## 2013-10-06 DIAGNOSIS — M2040 Other hammer toe(s) (acquired), unspecified foot: Secondary | ICD-10-CM

## 2013-10-06 DIAGNOSIS — M2010 Hallux valgus (acquired), unspecified foot: Secondary | ICD-10-CM

## 2013-10-06 NOTE — Progress Notes (Signed)
Subjective:     Patient ID: Kristina Greene, female   DOB: August 13, 1961, 52 y.o.   MRN: [id number]  HPI patient states this foot seems to hurt more but overall I'm doing okay. Several weeks after having extensive forefoot surgery right   Review of Systems     Objective:   Physical Exam Neurovascular status intact with no health history changes noted and good alignment of the first MPJ and lesser digits right foot with range of motion of 20 dorsiflexion 20 plantar flexion first MPJ incision sites are healed well with wound edges coapted well    Assessment:     Healing well post foot surgery of a extensive nature right forefoot.    Plan:     Stitches are removed and explained that swelling is normal at 2 weeks after surgery and to continue with elevation and compression at this time and gradual return to saw shoe in the next several weeks reappoint in 4 weeks to review and also x-rayed due to her concerns about swelling and discomfort

## 2013-10-31 NOTE — Progress Notes (Signed)
1) Austin bunionectomy right foot 2) Metatarsal osteotomy 5th met right foot 3) Hammer toe repair 2nd, 3rd, 4th, and 5th toes right foot

## 2013-11-03 ENCOUNTER — Encounter: Payer: PRIVATE HEALTH INSURANCE | Admitting: Podiatry

## 2013-11-16 ENCOUNTER — Encounter: Payer: PRIVATE HEALTH INSURANCE | Admitting: Podiatry

## 2013-11-23 ENCOUNTER — Encounter: Payer: PRIVATE HEALTH INSURANCE | Admitting: Podiatry

## 2013-12-05 ENCOUNTER — Encounter: Payer: PRIVATE HEALTH INSURANCE | Admitting: Podiatry

## 2013-12-14 ENCOUNTER — Encounter: Payer: Self-pay | Admitting: Podiatry

## 2013-12-14 ENCOUNTER — Ambulatory Visit (INDEPENDENT_AMBULATORY_CARE_PROVIDER_SITE_OTHER): Payer: PRIVATE HEALTH INSURANCE | Admitting: Podiatry

## 2013-12-14 ENCOUNTER — Ambulatory Visit (INDEPENDENT_AMBULATORY_CARE_PROVIDER_SITE_OTHER): Payer: PRIVATE HEALTH INSURANCE

## 2013-12-14 ENCOUNTER — Encounter: Payer: PRIVATE HEALTH INSURANCE | Admitting: Podiatry

## 2013-12-14 VITALS — BP 130/82 | HR 84 | Resp 12

## 2013-12-14 DIAGNOSIS — R52 Pain, unspecified: Secondary | ICD-10-CM

## 2013-12-14 DIAGNOSIS — M2040 Other hammer toe(s) (acquired), unspecified foot: Secondary | ICD-10-CM

## 2013-12-14 DIAGNOSIS — M779 Enthesopathy, unspecified: Secondary | ICD-10-CM

## 2013-12-14 MED ORDER — MELOXICAM 15 MG PO TABS: 15.0000 mg | ORAL_TABLET | Freq: Every day | ORAL | Status: DC

## 2013-12-15 NOTE — Progress Notes (Signed)
Subjective:     Patient ID: Kristina Greene, female   DOB: May 18, 1961, 53 y.o.   MRN: [id number]  HPI patient states she is doing well but getting some pain in her forefoot of both feet. Approximately 5 in 6 months after foot surgery on both the   Review of Systems     Objective:   Physical Exam Neurovascular status intact muscle strength adequate with patient well oriented x3. Incision sites on the forefeet of both feet are healing well with good range of motion first MPJ good digital alignment and moderate discomfort in the lesser MPJs bilateral    Assessment:     Capsulitis lesser MPJs bilateral with increased blood flow as part of the problem and well-healing surgical feet bilateral    Plan:     Scanned for custom orthotics to reduce stress on feet and at this time reviewed final x-rays reappoint when orthotics right

## 2014-01-27 ENCOUNTER — Encounter: Payer: Self-pay | Admitting: Podiatry

## 2014-01-27 ENCOUNTER — Other Ambulatory Visit: Payer: PRIVATE HEALTH INSURANCE

## 2014-02-03 ENCOUNTER — Telehealth: Payer: Self-pay | Admitting: *Deleted

## 2014-02-03 NOTE — Telephone Encounter (Signed)
Shoe inserts for Kristina Greene, we were told they were mailed to her.  We haven't seen them yet but our credit card has been charged.  So our question is where are they?  Why haven't we gotten them yet?

## 2014-06-08 ENCOUNTER — Ambulatory Visit: Payer: PRIVATE HEALTH INSURANCE | Admitting: Podiatry

## 2014-06-15 ENCOUNTER — Ambulatory Visit (INDEPENDENT_AMBULATORY_CARE_PROVIDER_SITE_OTHER): Payer: PRIVATE HEALTH INSURANCE

## 2014-06-15 ENCOUNTER — Encounter: Payer: Self-pay | Admitting: Podiatry

## 2014-06-15 ENCOUNTER — Ambulatory Visit (INDEPENDENT_AMBULATORY_CARE_PROVIDER_SITE_OTHER): Payer: PRIVATE HEALTH INSURANCE | Admitting: Podiatry

## 2014-06-15 VITALS — BP 117/72 | HR 73 | Resp 14 | Ht 64.0 in | Wt 156.0 lb

## 2014-06-15 DIAGNOSIS — M21619 Bunion of unspecified foot: Secondary | ICD-10-CM

## 2014-06-15 DIAGNOSIS — M2010 Hallux valgus (acquired), unspecified foot: Secondary | ICD-10-CM

## 2014-06-15 DIAGNOSIS — M7750 Other enthesopathy of unspecified foot: Secondary | ICD-10-CM

## 2014-06-15 MED ORDER — MELOXICAM 15 MG PO TABS: 15.0000 mg | ORAL_TABLET | Freq: Every day | ORAL | Status: DC

## 2014-06-15 NOTE — Progress Notes (Signed)
   Subjective:    Patient ID: Kristina Greene, female    DOB: Jun 09, 1961, 53 y.o.   MRN: [id number]  HPI Comments: Pt complains of pain across the length of the B/L bunions surgery sites, on and off especially in the morning, since the surgery.  Pt states she can walk but stiffly and may do better if takes an antiinflammatory medication before bed.  Pt complains of medial right heel pain for 3 months, uses the Meloxicam and Advil for this.  Pt complains of leg cramps for over 1 months.     Review of Systems  All other systems reviewed and are negative.      Objective:   Physical Exam        Assessment & Plan:

## 2014-06-15 NOTE — Progress Notes (Signed)
Subjective:     Patient ID: Kristina Greene, female   DOB: Apr 13, 1961, 53 y.o.   MRN: [id number]  HPI patient presents stating that she still gets some discomfort when she gets up in the morning that can last for around an hour and it seems to get better and that she does better if she takes an anti-inflammatory before bed. Her feet overall feel okay she just wants to make sure there is no problems with this   Review of Systems     Objective:   Physical Exam Neurovascular status intact with muscle strength adequate in range of motion within normal limits. All incision sites of healed well and there is mild edema still persisting but it is very low grade and there is no area where it is different than anywhere else. All toes are in good alignment and range of motion of the first and fifth MPJ is adequate    Assessment:     Very difficult to understand why she continues to get this low grade swelling but at this point I have recommended acupuncture and consistent anti-inflammatory usage    Plan:      reviewed her x-rays and dis meloxicam usage on a consistent basis for several months and acupuncture to try to block pain channels. Patient will be seen back to recheck as neededcussed

## 2015-02-13 ENCOUNTER — Encounter: Payer: Self-pay | Admitting: Internal Medicine

## 2015-04-02 ENCOUNTER — Encounter: Payer: Self-pay | Admitting: Internal Medicine

## 2015-04-06 ENCOUNTER — Encounter: Payer: Self-pay | Admitting: Internal Medicine

## 2015-04-10 ENCOUNTER — Encounter: Payer: Self-pay | Admitting: Internal Medicine

## 2015-04-26 ENCOUNTER — Encounter: Payer: Self-pay | Admitting: Internal Medicine

## 2016-03-31 ENCOUNTER — Other Ambulatory Visit: Payer: Self-pay | Admitting: Family Medicine

## 2016-03-31 DIAGNOSIS — Z1231 Encounter for screening mammogram for malignant neoplasm of breast: Secondary | ICD-10-CM

## 2016-04-01 ENCOUNTER — Other Ambulatory Visit: Payer: Self-pay | Admitting: Gynecology

## 2016-04-01 DIAGNOSIS — N6099 Unspecified benign mammary dysplasia of unspecified breast: Secondary | ICD-10-CM

## 2016-04-01 DIAGNOSIS — N6489 Other specified disorders of breast: Secondary | ICD-10-CM

## 2016-04-09 ENCOUNTER — Ambulatory Visit
Admission: RE | Admit: 2016-04-09 | Discharge: 2016-04-09 | Disposition: A | Discharge status: Home / Self Care | Source: Ambulatory Visit | Attending: Gynecology | Admitting: Gynecology

## 2016-04-09 ENCOUNTER — Ambulatory Visit
Admission: RE | Admit: 2016-04-09 | Discharge: 2016-04-09 | Disposition: A | Discharge status: Home / Self Care | Payer: BLUE CROSS/BLUE SHIELD | Source: Ambulatory Visit | Attending: Gynecology | Admitting: Gynecology

## 2016-04-09 DIAGNOSIS — N6489 Other specified disorders of breast: Secondary | ICD-10-CM

## 2016-04-09 DIAGNOSIS — N6099 Unspecified benign mammary dysplasia of unspecified breast: Secondary | ICD-10-CM

## 2016-04-09 IMAGING — MG 2D DIGITAL DIAGNOSTIC BILATERAL MAMMOGRAM WITH CAD AND ADJUNCT T
8 of 12 series · 8 of 28 positions shown · non-contrast
Comparison: [DATE], [DATE], [DATE], [DATE],
[DATE], [DATE].

CLINICAL DATA: The patient has a history of a surgical excisional
biopsy of the right breast which demonstrated a complex sclerosing
lesion and atypical lobular hyperplasia. Since that surgery she has
had pain in the surgical site. She also complains of episodic
bilateral breast pain. There is no discrete palpable abnormality on
her breast self-examination.

EXAM:
2D DIGITAL DIAGNOSTIC BILATERAL MAMMOGRAM WITH CAD AND ADJUNCT TOMO

[R MLO synth-2D]
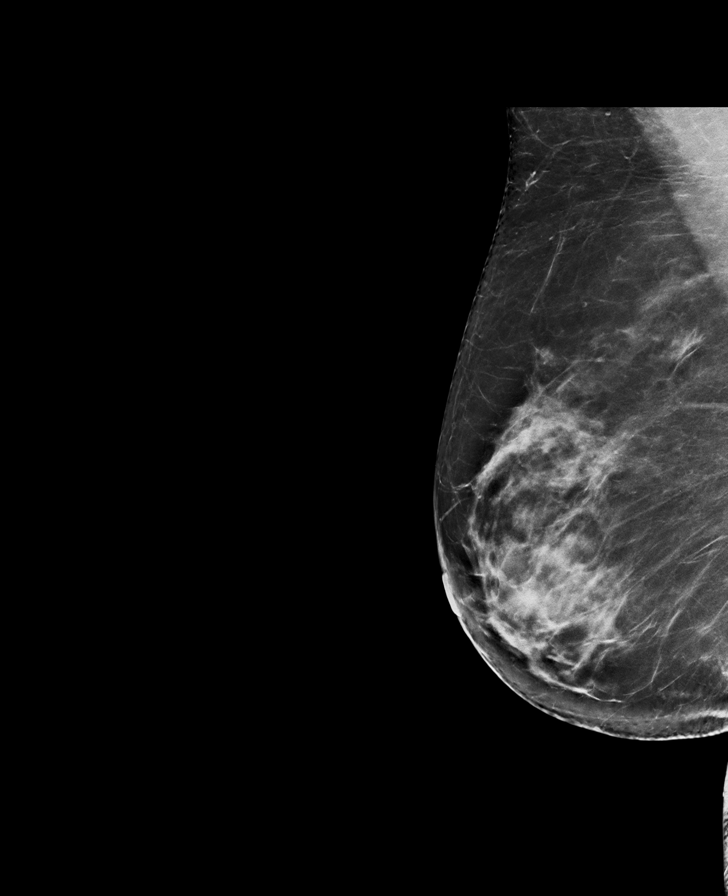

[R CC]
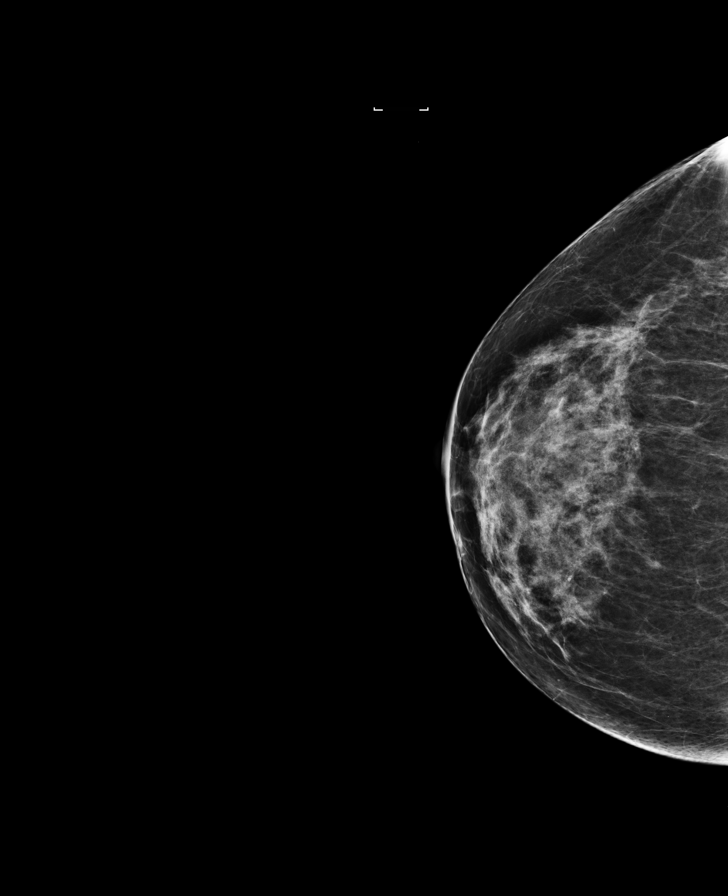

[L CC]
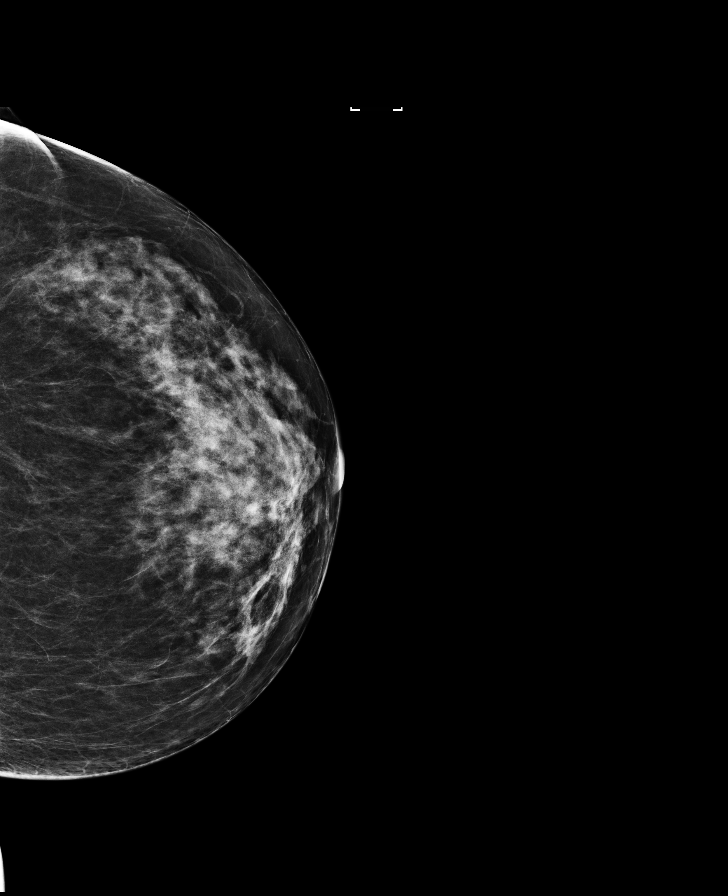

[L MLO synth-2D]
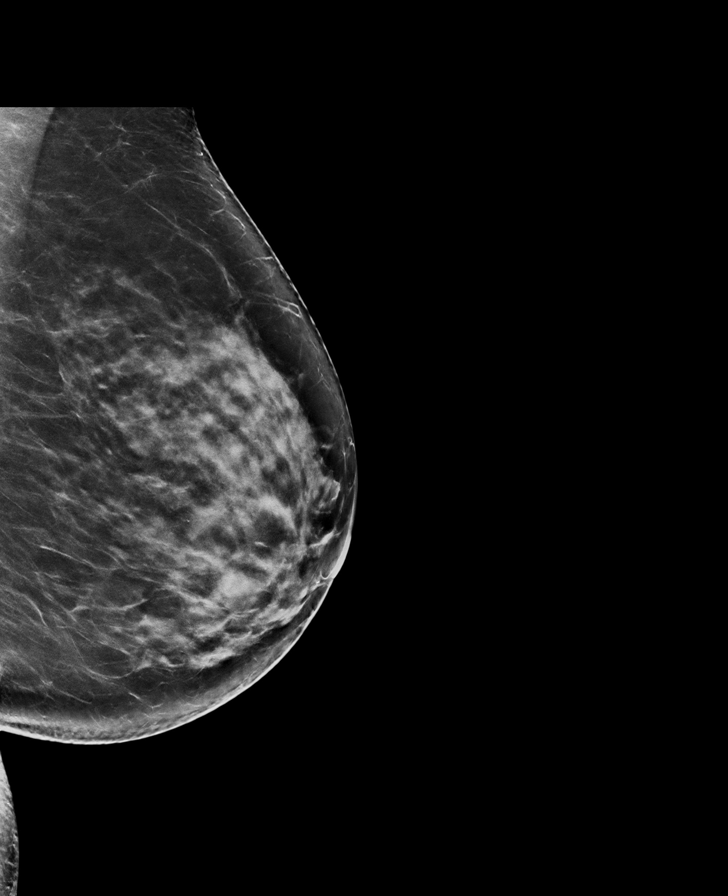

[L MLO]
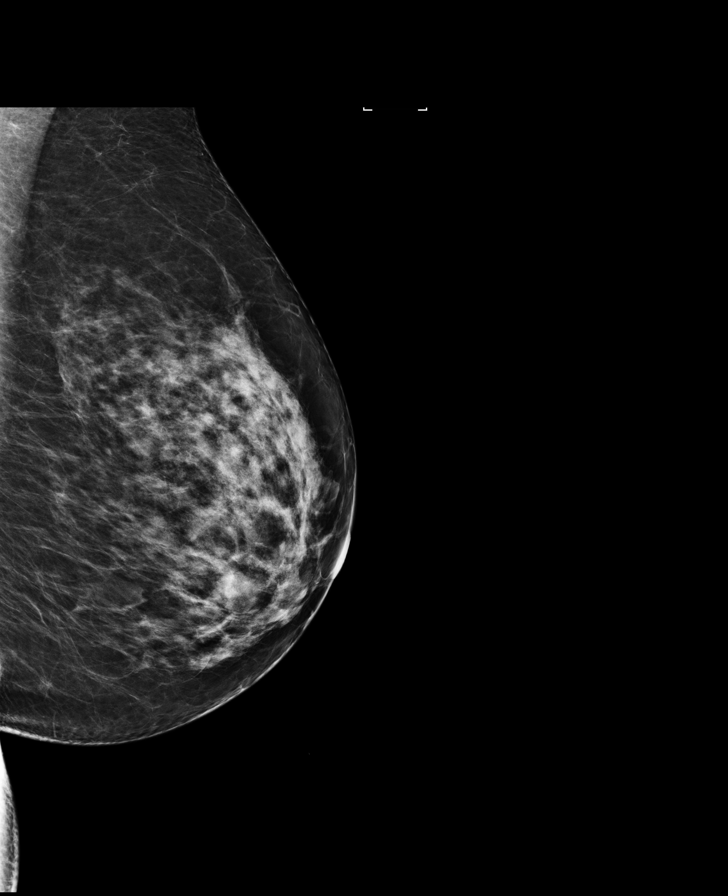

[L CC synth-2D]
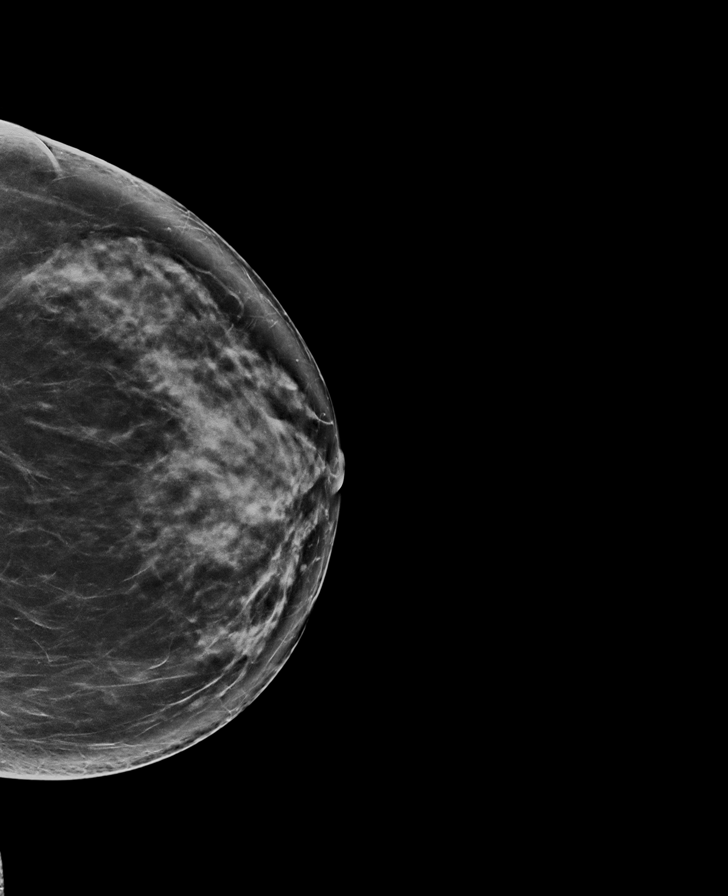

[R CC synth-2D]
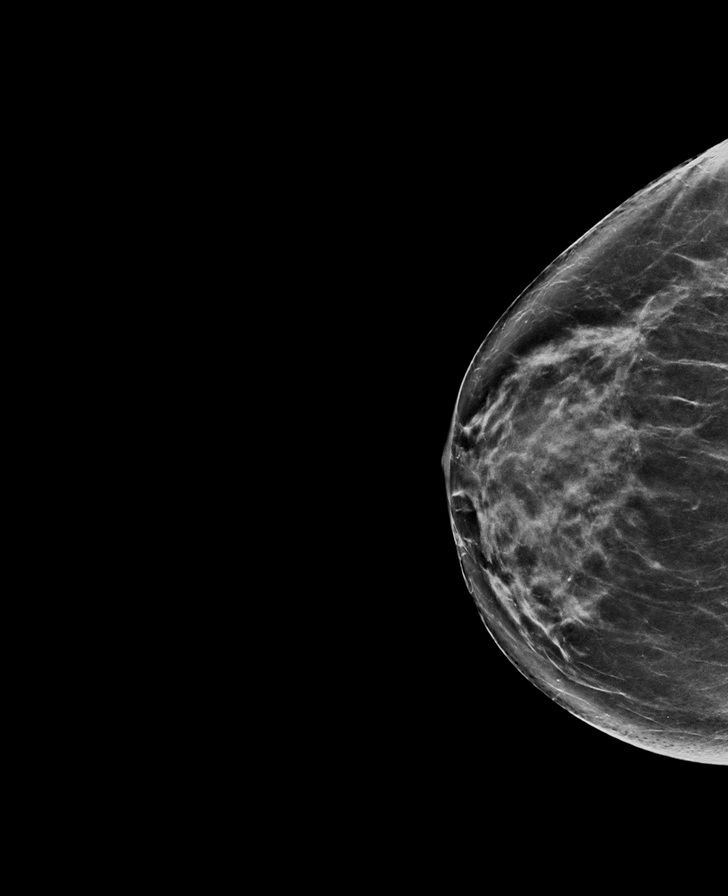

[R MLO]
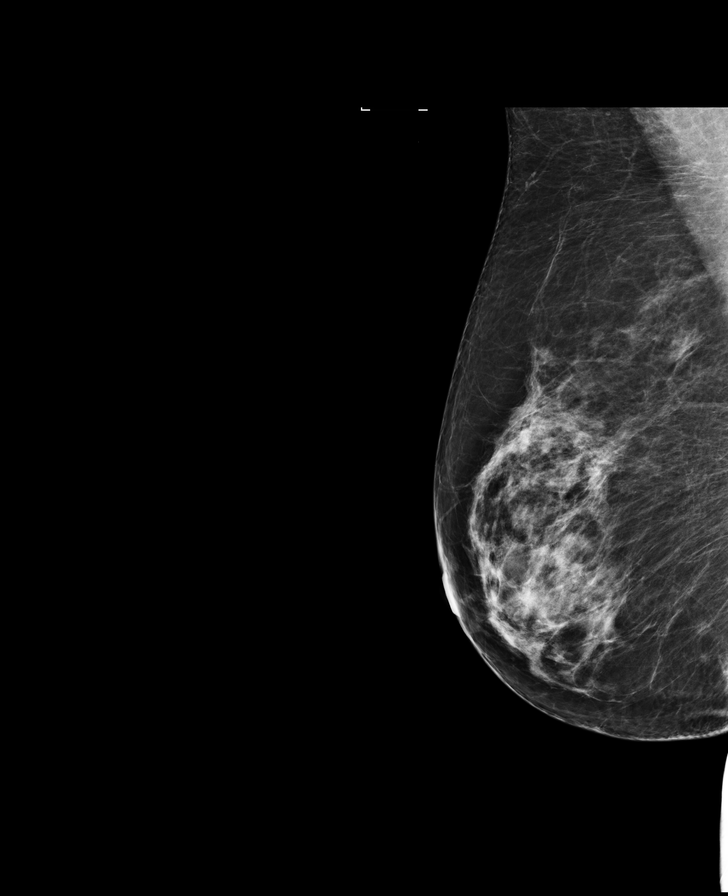

[8 of 28 positions shown; findings below may reference images not displayed]

ACR Breast Density Category c: The breast tissue is heterogeneously
dense, which may obscure small masses.
FINDINGS: There are stable scarring changes located laterally within the right
breast related to the patient's previous benign surgical excisional
biopsy. The breast parenchymal pattern is stable. There is no mass,
worrisome distortion, or worrisome calcification within either
breast.

Mammographic images were processed with CAD.
IMPRESSION: Stable breast parenchymal pattern and stable scarring changes
located laterally within the right breast. No findings worrisome for
developing malignancy. Most likely the patient's episodic bilateral
breast pain is on a hormonal basis.

RECOMMENDATION:
Bilateral screening mammography in 1 year.

I have discussed the findings and recommendations with the patient.
Results were also provided in writing at the conclusion of the
visit. If applicable, a reminder letter will be sent to the patient
regarding the next appointment.

BI-RADS CATEGORY  2: Benign.

## 2016-07-15 ENCOUNTER — Encounter: Payer: Self-pay | Admitting: Internal Medicine

## 2016-07-22 ENCOUNTER — Encounter: Payer: Self-pay | Admitting: Internal Medicine

## 2016-08-01 ENCOUNTER — Encounter: Payer: Self-pay | Admitting: Internal Medicine

## 2016-08-11 DIAGNOSIS — Z860100 Personal history of colon polyps, unspecified: Secondary | ICD-10-CM

## 2016-08-11 HISTORY — DX: Personal history of colonic polyps: Z86.010

## 2016-08-11 HISTORY — DX: Personal history of colon polyps, unspecified: Z86.0100

## 2017-05-12 ENCOUNTER — Other Ambulatory Visit: Payer: Self-pay | Admitting: Obstetrics and Gynecology

## 2017-05-12 DIAGNOSIS — N83209 Unspecified ovarian cyst, unspecified side: Secondary | ICD-10-CM

## 2017-05-12 DIAGNOSIS — N644 Mastodynia: Secondary | ICD-10-CM

## 2017-05-20 ENCOUNTER — Other Ambulatory Visit: Payer: BLUE CROSS/BLUE SHIELD

## 2017-05-20 ENCOUNTER — Ambulatory Visit: Payer: BLUE CROSS/BLUE SHIELD

## 2017-05-20 ENCOUNTER — Ambulatory Visit
Admission: RE | Admit: 2017-05-20 | Discharge: 2017-05-20 | Disposition: A | Discharge status: Home / Self Care | Payer: BLUE CROSS/BLUE SHIELD | Source: Ambulatory Visit | Attending: Obstetrics and Gynecology | Admitting: Obstetrics and Gynecology

## 2017-05-20 ENCOUNTER — Ambulatory Visit: Admission: RE | Admit: 2017-05-20 | Payer: BLUE CROSS/BLUE SHIELD | Source: Ambulatory Visit

## 2017-05-20 DIAGNOSIS — N644 Mastodynia: Secondary | ICD-10-CM

## 2017-05-20 IMAGING — MG 2D DIGITAL DIAGNOSTIC BILATERAL MAMMOGRAM WITH CAD AND ADJUNCT T
8 of 12 series · 8 of 28 positions shown · non-contrast
Comparison: Previous exam(s).

CLINICAL DATA: The patient presents with bilateral breast pain she
localizes across the inferior aspects of both breasts.She had a
previous right breast excision scarring along lateral to upper outer
right breast. This was benign. No reported lumps.

EXAM:
2D DIGITAL DIAGNOSTIC BILATERAL MAMMOGRAM WITH CAD AND ADJUNCT TOMO

[L MLO synth-2D]
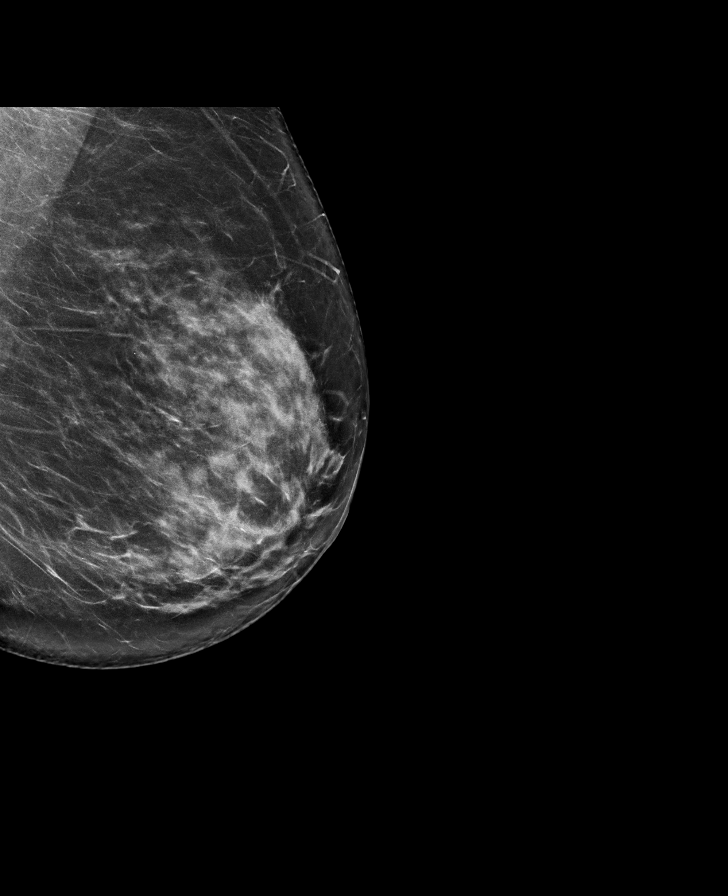

[R CC synth-2D]
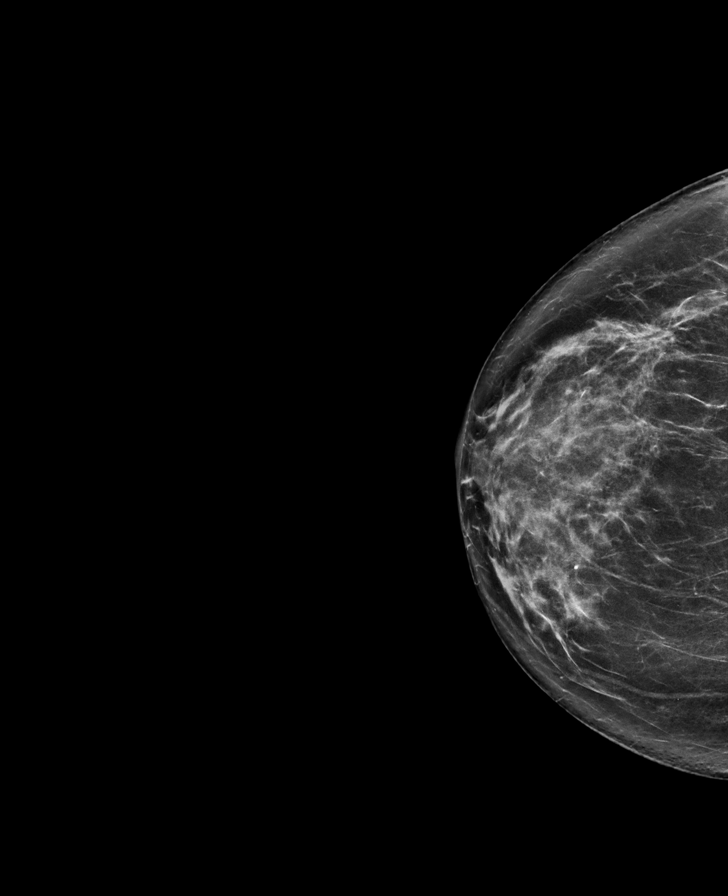

[R MLO synth-2D]
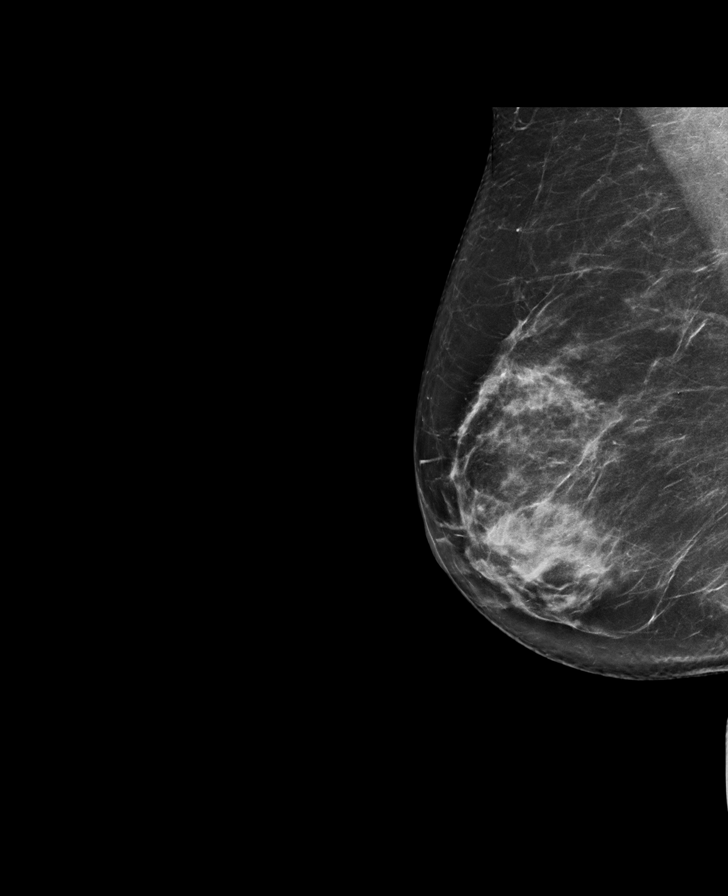

[L CC synth-2D]
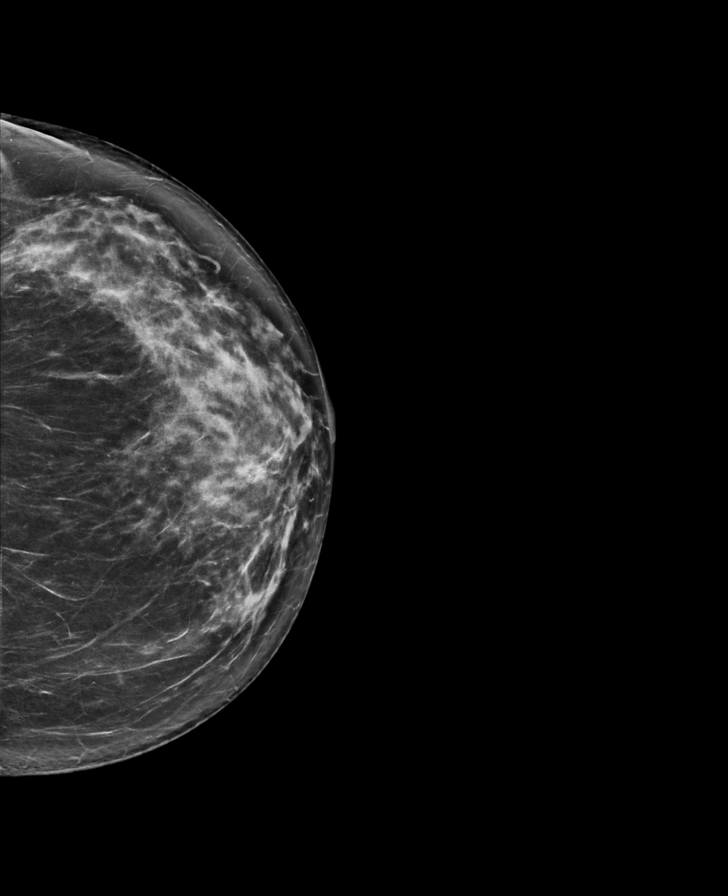

[L CC]
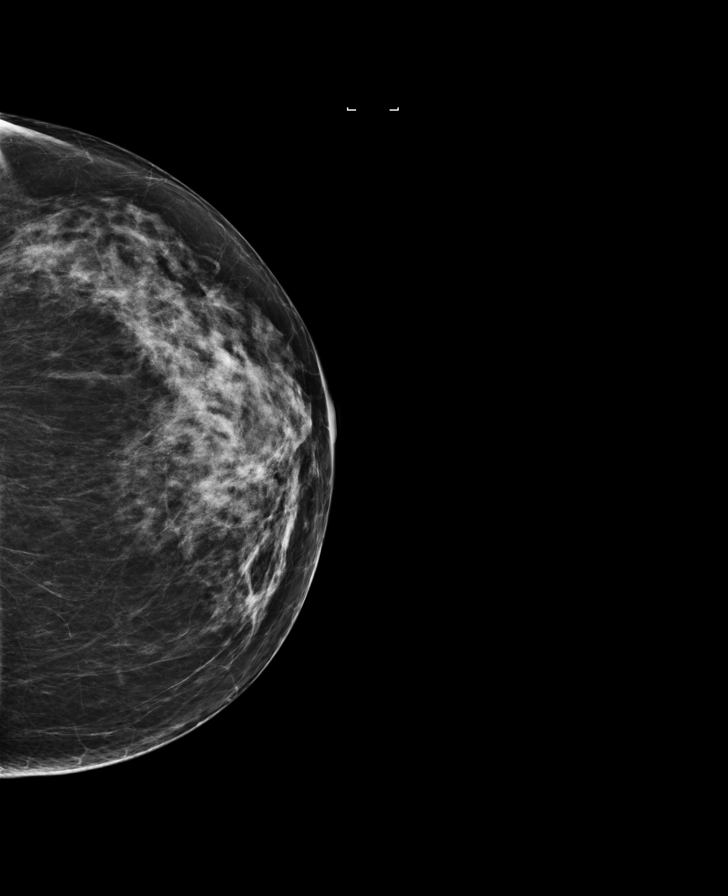

[R MLO]
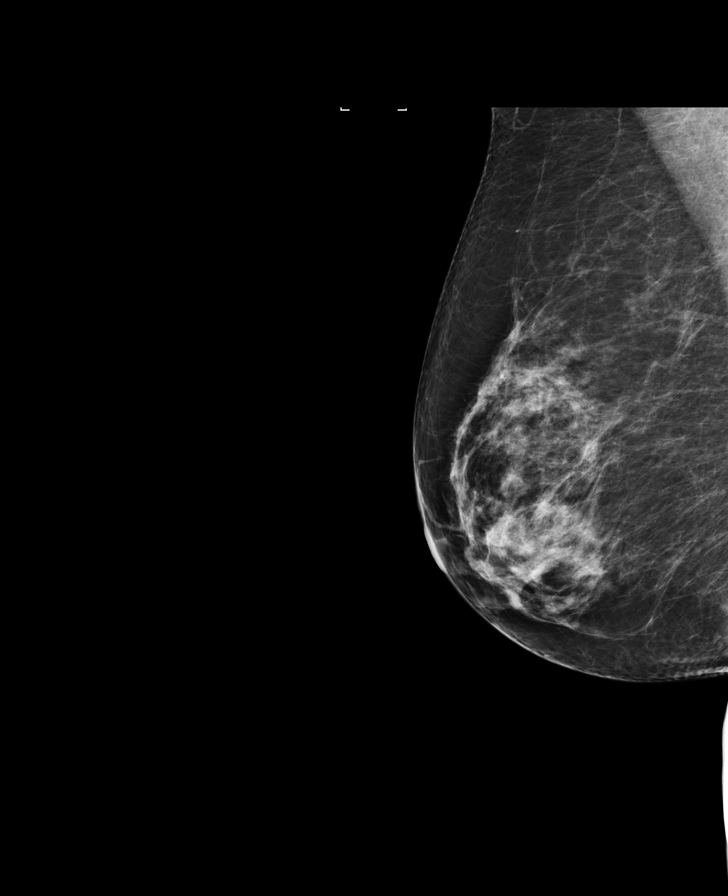

[L MLO]
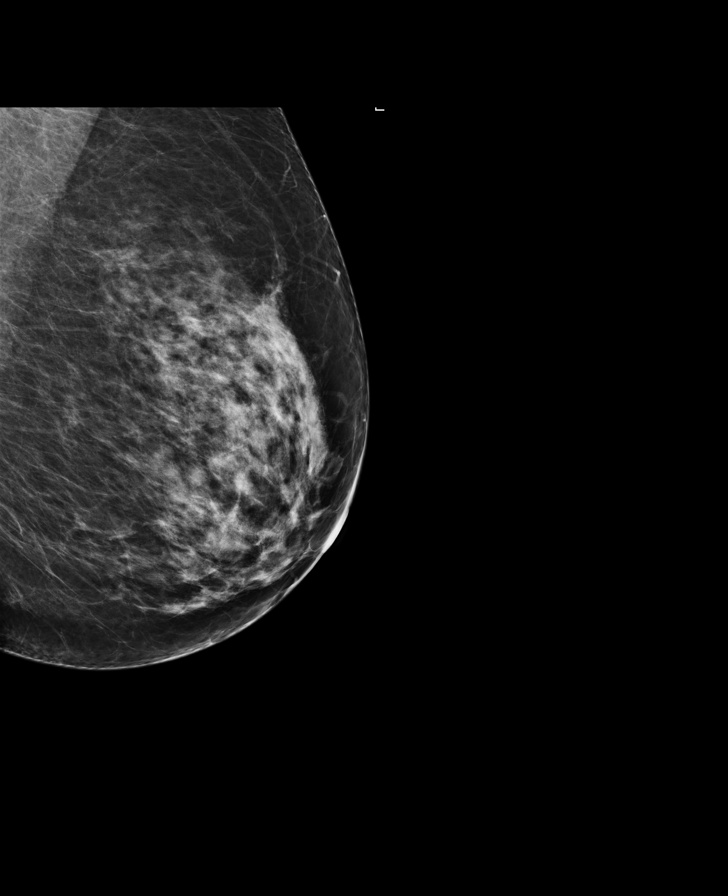

[R CC]
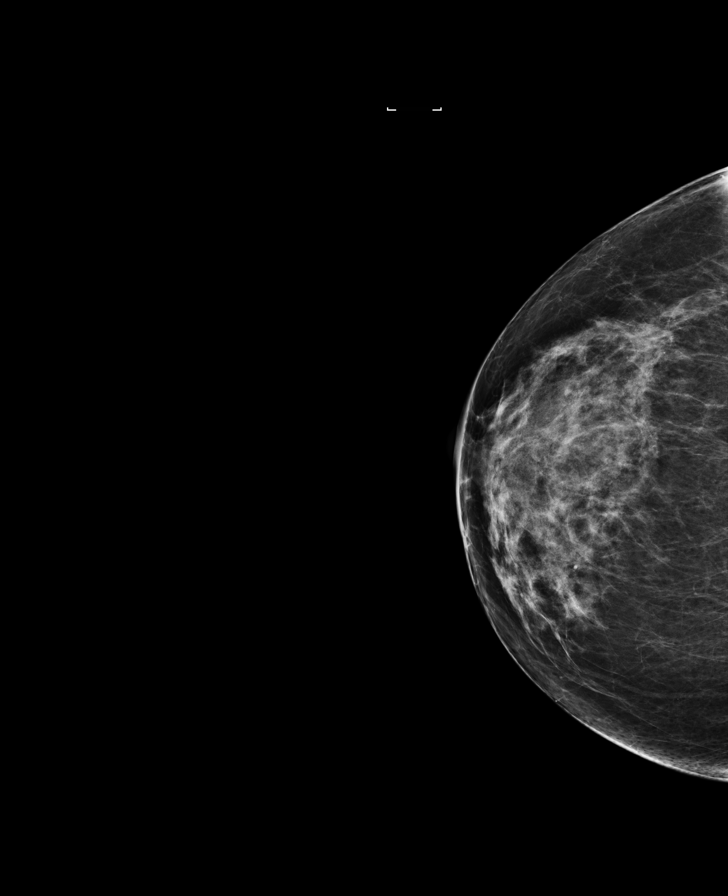

[8 of 28 positions shown; findings below may reference images not displayed]

ACR Breast Density Category c: The breast tissue is heterogeneously
dense, which may obscure small masses.
FINDINGS: There are no discrete masses. There is architectural distortion
reflecting the postsurgical scarring along the upper outer right
breast, stable. There are no other areas of architectural
distortion. There are no suspicious calcifications.

Mammographic images were processed with CAD.
IMPRESSION: 1. No evidence of malignancy.
2. Benign postsurgical scarring on the right.

RECOMMENDATION:
Screening mammogram in one year.(Code:[RT])

I have discussed the findings and recommendations with the patient.
Results were also provided in writing at the conclusion of the
visit. If applicable, a reminder letter will be sent to the patient
regarding the next appointment.

BI-RADS CATEGORY  2: Benign.

## 2017-06-16 ENCOUNTER — Telehealth: Payer: Self-pay | Admitting: Podiatry

## 2017-06-16 NOTE — Telephone Encounter (Signed)
Called pt back in regards to the message she left requesting her medical records. I told pt she would need to sign a copy of the medical records release for me to be able to release her records. Pt requested me to e-mail her a copy and she will get it back to me by tomorrow morning.

## 2017-06-16 NOTE — Telephone Encounter (Signed)
I was a patient of Dr. Mellody Drown and I need my medical records to be sent to a doctor here in [place]. I would appreciate a call back at [phone] to to provide the information to where the medical records need to be sent.

## 2017-06-18 ENCOUNTER — Encounter: Payer: Self-pay | Admitting: Podiatry

## 2017-06-18 DIAGNOSIS — M79676 Pain in unspecified toe(s): Secondary | ICD-10-CM

## 2017-06-18 NOTE — Progress Notes (Signed)
CD of X-rays requested by pt mailed to Dr. Marta Antu at [facility] in [place], [place] today. [place]

## 2018-06-04 ENCOUNTER — Other Ambulatory Visit: Payer: Self-pay | Admitting: Obstetrics and Gynecology

## 2018-06-04 DIAGNOSIS — Z1239 Encounter for other screening for malignant neoplasm of breast: Secondary | ICD-10-CM

## 2018-06-18 ENCOUNTER — Other Ambulatory Visit: Payer: Self-pay | Admitting: Obstetrics and Gynecology

## 2018-06-18 DIAGNOSIS — Z1231 Encounter for screening mammogram for malignant neoplasm of breast: Secondary | ICD-10-CM

## 2019-07-25 ENCOUNTER — Other Ambulatory Visit: Payer: Self-pay | Admitting: Gynecology

## 2019-07-25 DIAGNOSIS — N644 Mastodynia: Secondary | ICD-10-CM

## 2019-12-29 ENCOUNTER — Ambulatory Visit: Payer: BLUE CROSS/BLUE SHIELD

## 2019-12-29 ENCOUNTER — Other Ambulatory Visit: Payer: Self-pay

## 2019-12-29 ENCOUNTER — Ambulatory Visit
Admission: RE | Admit: 2019-12-29 | Discharge: 2019-12-29 | Disposition: A | Discharge status: Home / Self Care | Payer: BC Managed Care – PPO | Source: Ambulatory Visit | Attending: Gynecology | Admitting: Gynecology

## 2019-12-29 DIAGNOSIS — N644 Mastodynia: Secondary | ICD-10-CM

## 2019-12-29 IMAGING — MG DIGITAL DIAGNOSTIC BILAT W/ TOMO W/ CAD
8 series · 8 of 24 positions shown · non-contrast
Comparison: [DATE] and earlier

CLINICAL DATA: Patient describes pain in both breasts. She has
noted pain within the RIGHT breast after an excisional biopsy was
performed in [DATE] complex sclerosing lesion and atypical lobular
hyperplasia. Pain is diffuse, bilateral, and comes and goes.Patient
has a personal history of thyroid cancer. No previous family history
of breast cancer.

EXAM:
DIGITAL DIAGNOSTIC BILATERAL MAMMOGRAM WITH CAD AND TOMO

[R CC synth-2D]
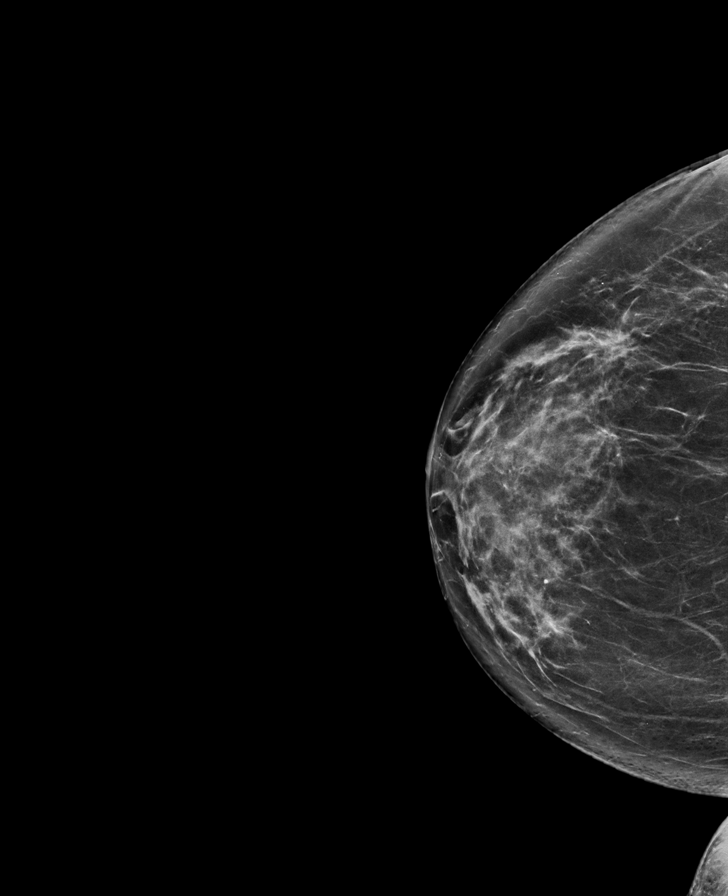

[R MLO synth-2D]
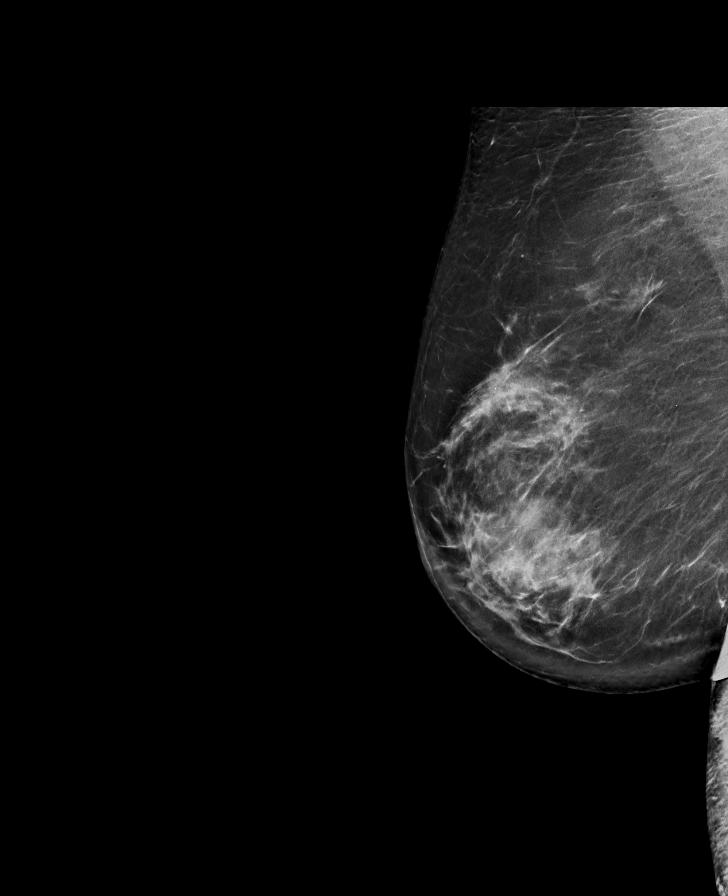

[L CC synth-2D]
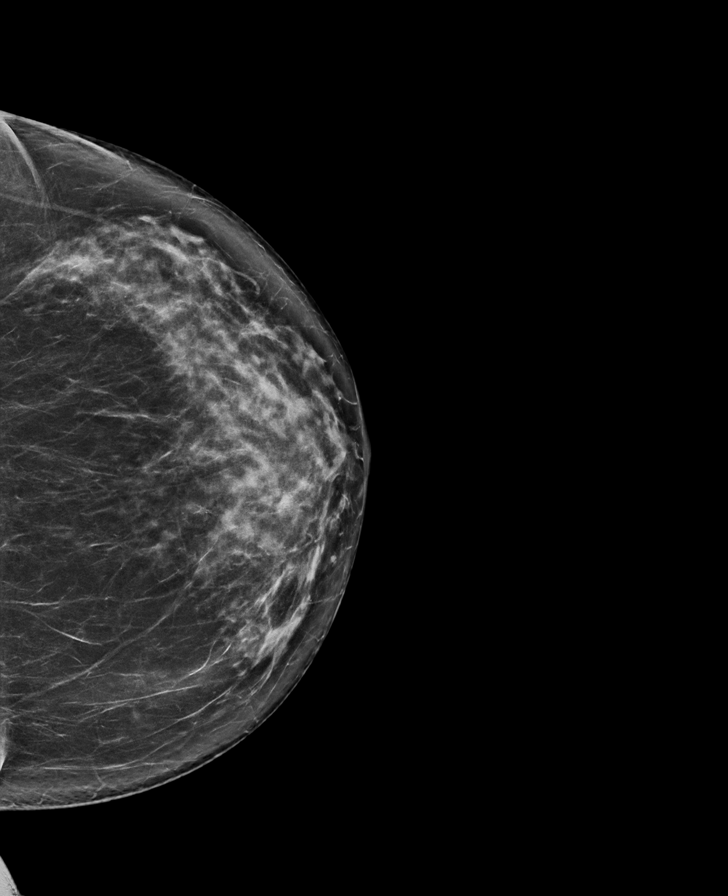

[L MLO synth-2D]
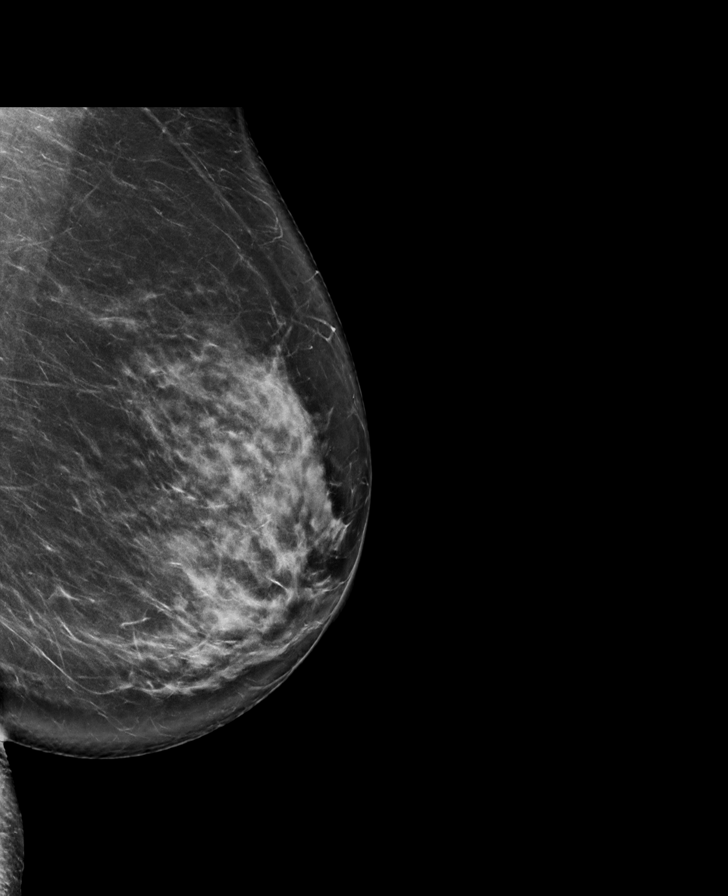

[R MLO tomo · tomo slice 43/86.0]
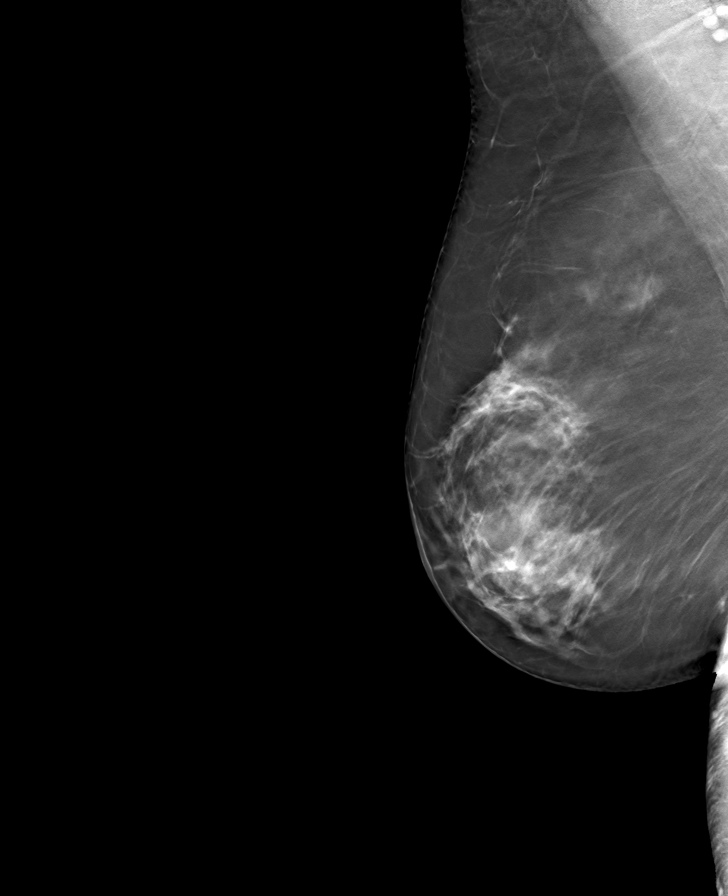

[L MLO tomo · tomo slice 44/87.0]
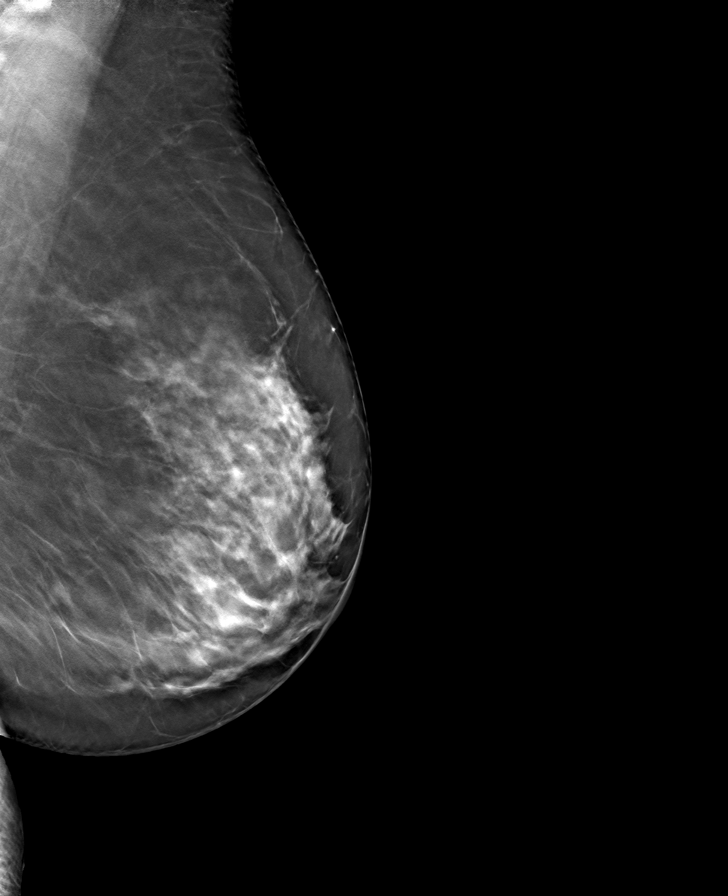

[R CC tomo · tomo slice 38/75.0]
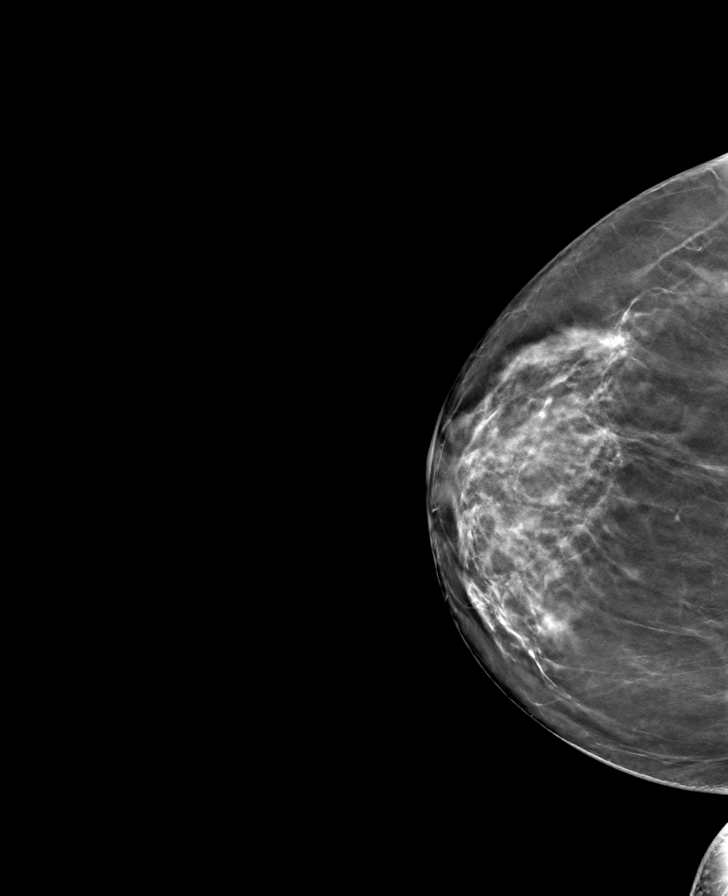

[L CC tomo · tomo slice 41/80.0]
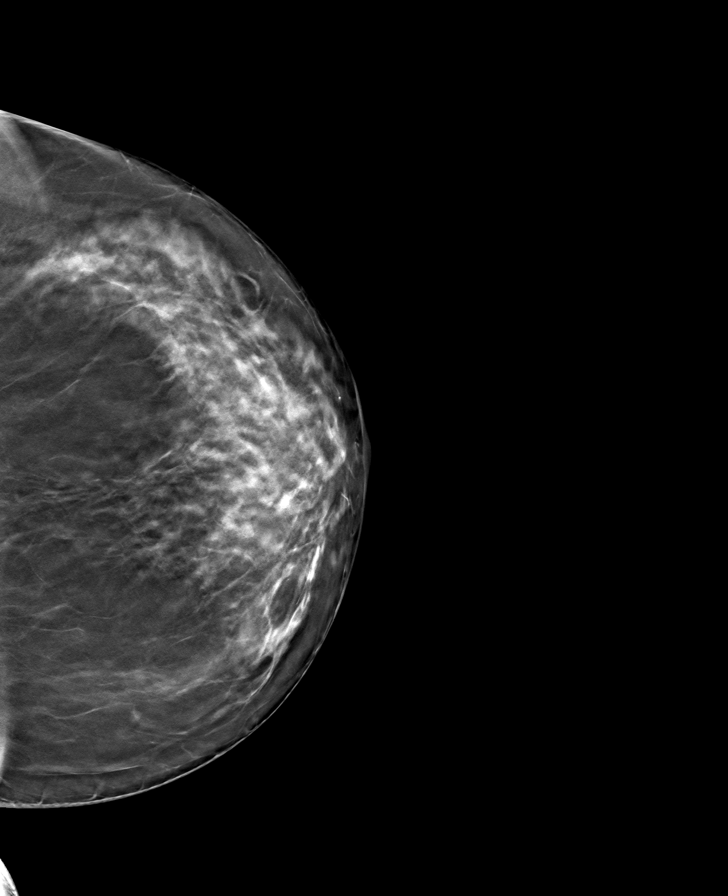

[8 of 24 positions shown; findings below may reference images not displayed]

ACR Breast Density Category c: The breast tissue is heterogeneously
dense, which may obscure small masses.
FINDINGS: No suspicious mass, distortion, or microcalcifications are
identified to suggest presence of malignancy. Post operative changes
are seen in the RIGHTbreast.

Mammographic images were processed with CAD.
IMPRESSION: No mammographic evidence for malignancy.

The patient has significant concerns about her breast pain. Given
her history of thyroid cancer, fatty liver, and a palpable abdominal
lipoma, she has concerns that something else is going on. Patient is
a candidate for abbreviated MRI, given her dense breast tissue and
intermediate risk of breast cancer.

RECOMMENDATION:
Screening mammogram in one year.(Code:[R9])

Consider abbreviated MRI.

I have discussed the findings and recommendations with the patient.
If applicable, a reminder letter will be sent to the patient
regarding the next appointment.

BI-RADS CATEGORY  2: Benign.

## 2020-03-12 ENCOUNTER — Encounter: Payer: Self-pay | Admitting: Internal Medicine

## 2020-03-12 ENCOUNTER — Telehealth: Payer: Self-pay | Admitting: Internal Medicine

## 2020-03-12 DIAGNOSIS — Z86010 Personal history of colonic polyps: Secondary | ICD-10-CM

## 2020-03-12 NOTE — Telephone Encounter (Signed)
Dr. Carlean Purl, this patient was referred for a colonoscopy and requested you as a GI MD because her husband is your patient.  Patient had a colonoscopy in 2017 in [place].  Records will be sent to you for review.

## 2020-03-20 ENCOUNTER — Encounter: Payer: Self-pay | Admitting: Internal Medicine

## 2020-03-20 NOTE — Telephone Encounter (Signed)
Please schedule direct colonoscopy hx colon polyp 2017

## 2020-03-26 ENCOUNTER — Encounter: Payer: Self-pay | Admitting: Internal Medicine

## 2020-03-26 NOTE — Telephone Encounter (Signed)
A message was left for patient to call back to schedule.  

## 2020-04-05 ENCOUNTER — Encounter: Payer: Self-pay | Admitting: Internal Medicine

## 2020-04-16 ENCOUNTER — Encounter: Payer: Self-pay | Admitting: Podiatry

## 2020-04-16 ENCOUNTER — Ambulatory Visit (INDEPENDENT_AMBULATORY_CARE_PROVIDER_SITE_OTHER): Payer: BC Managed Care – PPO | Admitting: Podiatry

## 2020-04-16 ENCOUNTER — Ambulatory Visit (INDEPENDENT_AMBULATORY_CARE_PROVIDER_SITE_OTHER): Payer: BC Managed Care – PPO

## 2020-04-16 ENCOUNTER — Other Ambulatory Visit: Payer: Self-pay

## 2020-04-16 DIAGNOSIS — M2041 Other hammer toe(s) (acquired), right foot: Secondary | ICD-10-CM

## 2020-04-16 DIAGNOSIS — L84 Corns and callosities: Secondary | ICD-10-CM

## 2020-04-16 DIAGNOSIS — B351 Tinea unguium: Secondary | ICD-10-CM | POA: Diagnosis not present

## 2020-04-16 MED ORDER — TERBINAFINE HCL 250 MG PO TABS: ORAL_TABLET | ORAL | 0 refills | Status: DC

## 2020-04-17 ENCOUNTER — Other Ambulatory Visit: Payer: Self-pay | Admitting: Podiatry

## 2020-04-17 DIAGNOSIS — M2041 Other hammer toe(s) (acquired), right foot: Secondary | ICD-10-CM

## 2020-04-18 NOTE — Progress Notes (Signed)
Subjective:   Patient ID: Kristina Greene, female   DOB: 59 y.o.   MRN: [id number]   HPI Patient presents with some discoloration of the right big toenail that she is already had cultured and pathology negative for signs of pathology and a chronic lesion between the fourth and fifth digits of her right foot which becomes very painful.  Does not smoke likes to be active   Review of Systems  All other systems reviewed and are negative.       Objective:  Physical Exam Vitals and nursing note reviewed.  Constitutional:      Appearance: She is well-developed.  Pulmonary:     Effort: Pulmonary effort is normal.  Musculoskeletal:        General: Normal range of motion.  Skin:    General: Skin is warm.  Neurological:     Mental Status: She is alert.     Neurovascular status intact muscle strength found to be adequate range of motion within normal limits.  Patient is found to have a chronic lesion fourth interspace right which is painful with movement of the fifth digit and has discoloration of the left big toenail.  Patient has good digit perfusion well oriented x3    Assessment:  Chronic lesion which is related to pressure between the fourth and fifth digits of the right foot with damaged right hallux nail most likely trauma     Plan:  H&P reviewed all conditions between the fourth and fifth toes I recommended a partial soft tissue syndactylization without bone surgery as there is been bone removed in the past and I do not recommend treatment of the nail.  She wants to get this fixed and I explained procedure risk and she wants surgery and at this point I allowed her to go over consent form for partial syndactylization understanding risk no guarantee this will fix the problem.  She signed consent form after review scheduled for outpatient surgery  X-rays indicated that there is previous removal of bone head of proximal phalanx digit 5 right

## 2020-05-22 ENCOUNTER — Encounter: Payer: Self-pay | Admitting: Internal Medicine

## 2020-05-23 ENCOUNTER — Encounter: Payer: Self-pay | Admitting: Internal Medicine

## 2020-05-23 ENCOUNTER — Ambulatory Visit (INDEPENDENT_AMBULATORY_CARE_PROVIDER_SITE_OTHER): Payer: BC Managed Care – PPO | Admitting: Internal Medicine

## 2020-05-23 VITALS — BP 124/62 | HR 66 | Ht 64.0 in | Wt 169.8 lb

## 2020-05-23 DIAGNOSIS — K760 Fatty (change of) liver, not elsewhere classified: Secondary | ICD-10-CM | POA: Diagnosis not present

## 2020-05-23 DIAGNOSIS — Z86010 Personal history of colonic polyps: Secondary | ICD-10-CM

## 2020-05-23 DIAGNOSIS — R0789 Other chest pain: Secondary | ICD-10-CM

## 2020-05-23 DIAGNOSIS — R1011 Right upper quadrant pain: Secondary | ICD-10-CM

## 2020-05-23 DIAGNOSIS — R1012 Left upper quadrant pain: Secondary | ICD-10-CM

## 2020-05-23 DIAGNOSIS — E65 Localized adiposity: Secondary | ICD-10-CM

## 2020-05-23 DIAGNOSIS — G9389 Other specified disorders of brain: Secondary | ICD-10-CM

## 2020-05-23 MED ORDER — DICLOFENAC SODIUM 1 % EX GEL: 4.0000 g | Freq: Four times a day (QID) | CUTANEOUS | 11 refills | Status: DC

## 2020-05-23 NOTE — Progress Notes (Signed)
Kristina Greene 59 y.o. [birth year] [id number]  Assessment & Plan:   Encounter Diagnoses  Name Primary?  Marland Kitchen NAFLD (nonalcoholic fatty liver disease) Yes  . Abdominal obesity   . Hx of colonic polyp   . Xiphodynia   . Bilateral upper abdominal pain   . 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  She asked about whether or not it would be appropriate to sedate her given this encephalomalacia and the question of prior stroke.  I think the colonoscopy remains elective though it is technically a little over do it makes perfect sense to sort out potential neurologic issues before we do a colonoscopy.  I will place a reminder/recall for November of this year.  With respect to her nonalcoholic fatty liver disease I do not think imaging would change anything.  Her platelets are normal and when I run her numbers on fibrosis 4 in the fatty liver scales she might have fibrosis up to 2 or 3 with those but it could be less and it would not change what we do.  I have recommended she try to lose some of her belly fat and have suggested she look into low-carb possibly keto diets and also consider restricted feeding and intermittent fasting.  I have given her some references to Dr. Sharman Cheek, and Dr. Enrigue Catena in the diet doctor website.  I told her to explore those and not rush into anything but to see about changing eating habits.  She seems like a very bright and insightful lady who can work on this.  Overall the risk of cirrhosis from her liver disease remains very low but it is appropriate to have some concern.  Weight loss is really the only known treatment at this time.  I do not think I would pursue pharmacologic weight loss measures in her however.  We will hold off on any imaging that could include hepatic elastography.   With respect to the xiphodynia I have suggested she use diclofenac gel 1% 3-4 times a day on this area.   The possibility of injection therapy in this area is entertained but we will hold off on that.  She does have scoliosis and I think it is an excellent possibility that she has some sort of nerve impingement really getting scoliosis or spine that could be related to those pains that she gets left greater than right.  Do not think it is a serious health problem based upon all of the imaging and exams she has had.  That is not to say that it is not disrupting her quality of life.  I suggested she consider being seen by a spine specialist at some point whether orthopedic versus other.  We can revisit this when she comes back for her colonoscopy and to see whether or not the diclofenac is helping with xiphodynia.  I told her I would look into whether or not she could get a neurology appointment here in [place] sooner than November.  I do think we have excellent neurology help in this area to try to sort out this question of encephalomalacia and what it means.  I appreciate the opportunity to care for this patient. CC: Talmage Coin, MD   Subjective:   Chief Complaint: History of colon polyps, abdominal pain, nonalcoholic fatty liver disease  HPI The patient is a 59 year old married white woman, originally from [place], here with several issues:  The  first is history of colon polyps and request for repeat colonoscopy.  I have records from last colonoscopy in [place] state that show she had removal of a sessile serrated adenoma that was described as 12 mm in size and located in the a sending colon..  Colonoscopy and GI evaluation was performed by Dr. Nelda Marseille.  This was in October 2017 there was a prior history of adenomatous colon polyps as well.  2013 in [place] Dr. Posey Pronto.  She wants to schedule a repeat colonoscopy for surveillance which is appropriate but she had an MRI of the brain at [facility] (May 2021) in follow-up of prior pituitary tumor removal that shows a new right encephalomalacia  thought perhaps due to a chronic infarct.  She is awaiting a neurology appointment at [facility] in November but is hopeful she could obtain a sooner neurology appointment elsewhere.  I do not think there are focal deficits necessarily related to that though she wonders about some personality changes and things like that that she went through that could have been related to a possible TIA or stroke.  Another problem is fatty liver disease.  She has been diagnosed with nonalcoholic fatty liver disease based upon ultrasound findings of increased echogenicity and a CT scan of the abdomen and pelvis when she was in [place] describing changes consistent with steatosis.  She has had some mild abnormalities in transaminases not more than twice abnormal over time.  She wonders if she should have a repeat assessment with ultrasound.  She has gained a little bit of weight she is technically not obese but she has had increased abdominal girth.  LFTs in June were normal however she had labs on May 22, 2020 i.e. yesterday with bilirubin total 1.3 AST 37 which is top normal and an ALT of 61 and a normal alk phos.  Kidney function normal platelets are normal in the 180 range.  Hemoglobin normal.  She also has some chronic abdominal pains, and epigastric pain around the xiphoid that Dr. He thought might be xiphoid tinea, and left and right upper quadrant pains that defied explanation though he wondered if it could be related to some type of nerve impingement.  Mostly on the left.  She did have some scoliosis or does.  She had a thoughtful work-up by Dr. Nelda Marseille in 2016.  Described as epigastric pain that can radiate to the left and right costo diaphragmatic borders that is intermittent self-limited and can last for 12 to 24 hours.  PPI therapy did not help it.  An EGD was done at one point that demonstrated a normal esophagus, some erythema and erosions in the stomach were just 1 erosion and a normal duodenum.  He took biopsies  which were unrevealing.  Mild chronic inactive gastritis she had some intestinal metaplasia of the antrum and an unremarkable esophageal biopsy.  She has had testing for celiac disease with serologies, negative ANA amylase lipase and trypsin were okay as well.  She had a hypercontractile ejection fraction greater than 90% on a HIDA scan with [facility].  He had HBV DNA levels because of a history of childhood hepatitis B and these were negative.  He was considering a diagnostic nerve block perhaps.  However she moved back this way and that has not been pursued.  She has seen ENT at [facility] and is considering sinus surgery because of scarring in the nasal cavity and sphenoid area and there is a question of whether or not it could be related to  some headaches she has  Normal stress echo 04/08/2020 [facility] CT sinus May 03, 2020 [facility] showing postsurgical changes of the right paranasal sinuses mild mucosal thickening in sphenoid sinuses and inferior left frontal sinus  TSH 0.308 February 2021 with normal T4 1.21 No Known Allergies Current Meds  Medication Sig  . aspirin EC 81 MG tablet Take 81 mg by mouth daily. Swallow whole.  . Cholecalciferol (VITAMIN D-3 PO) Take 1 capsule by mouth daily.   . Coenzyme Q10 (COQ10) 100 MG CAPS Take 1 capsule by mouth daily.  Marland Kitchen levothyroxine (SYNTHROID, LEVOTHROID) 88 MCG tablet Take 88 mcg by mouth daily before breakfast.  . liothyronine (CYTOMEL) 5 MCG tablet Take 5 mcg by mouth daily.  . Multiple Vitamin (MULTIVITAMIN) capsule Take 1 capsule by mouth daily.  . rosuvastatin (CRESTOR) 10 MG tablet Take 10 mg by mouth daily.  Marland Kitchen terbinafine (LAMISIL) 250 MG tablet Please take one a day x 7days, repeat every 4 weeks x 4 months  . vitamin B-12 (CYANOCOBALAMIN) 1000 MCG tablet Take 1,000 mcg by mouth daily.   Past Medical History:  Diagnosis Date  . Gastritis    mild chronic  . GERD (gastroesophageal reflux disease)    occasionally  . Hepatic steatosis    without steatohepatitis   . History of thyroid cancer   . Hx of colonic polyps 08/11/2016   12 mm ssp 2017  . Hyperlipidemia   . Hypothyroidism   . Multiple allergies   . Pancreatitis    distant history  . Pituitary macroadenoma ([place])   . Thyroid cancer ([place])   . Thyroid disease    Past Surgical History:  Procedure Laterality Date  . APPENDECTOMY  1986  . [place] bunionectomy Left 06-21-2013  . BREAST BIOPSY  09/27/2012   Procedure: BREAST BIOPSY WITH NEEDLE LOCALIZATION;  Surgeon: Haywood Lasso, MD;  Location: [place];  Service: General;  Laterality: Right;  . BREAST BIOPSY Right 05/08/2011  . BREAST EXCISIONAL BIOPSY Right 09/27/2012  . BUNIONECTOMY Right 09-13-2013  . COLONOSCOPY    . ESOPHAGOGASTRODUODENOSCOPY    . HAMMER TOE SURGERY Left    2nd, 3rd, 4th, 5th  . HAMMER TOE SURGERY Right 09-13-2013  . METATARSAL OSTEOTOMY Left 06-21-2013   5th  . THYROIDECTOMY  2009  . TRANSPHENOIDAL PITUITARY RESECTION     Social History   Social History Narrative   Originally from [place] in [place]   Married   + children   Has been employed at [employer]   No alcohol never smoker 3-4 caffeinated beverages daily   family history includes Colon cancer in her paternal uncle; Liver disease in her brother.   Review of Systems Some headaches back pain night sweats allergy issues all other review of systems are negative n  Objective:   Physical Exam BP 124/62   Pulse 66   Ht 5' 4"  (1.626 m)   Wt 169 lb 12.8 oz (77 kg)   LMP 01/04/2013 Comment: menopausal  BMI 29.15 kg/m  Pleasant well-developed well-nourished white woman in no acute distress Eyes are anicteric Lungs are clear There is some levoscoliosis in the spine thoracic Heart sounds are normal The abdomen is somewhat obese soft she is markedly tender on the xiphoid process and below that less so.  Some left upper quadrant discomfort no rib pain on the lateral aspects. No cyanosis clubbing or edema Appropriate  mood and affect   Extensive data review through care everywhere records personally brought by the patient [place]  state GI work-up as reflected in the HPI

## 2020-05-23 NOTE — Patient Instructions (Addendum)
Check into the following web sites regarding fatty liver and changing eating to lose belly fat - restricted feeding and some intermittent fasting can help.   Dr. Enrigue Catena  Dr. Sharman Cheek - The Fasting way  [email or web address]   Please purchase over the counter generic Voltaren gel 1% (Diclofenac) and apply 3-4 times a day to the tender area.    I appreciate the opportunity to care for you. Silvano Rusk, MD, [facility]

## 2020-05-25 ENCOUNTER — Telehealth: Payer: Self-pay

## 2020-05-25 ENCOUNTER — Telehealth: Payer: Self-pay | Admitting: Internal Medicine

## 2020-05-25 DIAGNOSIS — G9389 Other specified disorders of brain: Secondary | ICD-10-CM

## 2020-05-25 NOTE — Telephone Encounter (Signed)
Kristina Greene could see that I had tried to call her so she called me back. I let her know that we have placed an urgent referral to [facility] for her. I gave her their # [phone] and address: [phone]  [place] [address].  She will await to hear from them.

## 2020-05-25 NOTE — Telephone Encounter (Signed)
-----   Message from Gatha Mayer, MD sent at 05/24/2020  1:43 PM EDT ----- Regarding: neuro appointment Please see if [facility] or [facility] can see her in next couple of mos (has Nov appt [facility]) as a new patient regarding encephalomalacia right temporal lobe on MR brain  If cannot get an appoint sooner than Nov let patient know  Any provider ok

## 2020-05-25 NOTE — Telephone Encounter (Signed)
I spoke with Hinton Dyer at [facility] and she will be on the look out for the urgent referral to come thru.

## 2020-05-25 NOTE — Telephone Encounter (Signed)
Hey Dr Carlean Purl, Kristina Greene from [facility] would like to inform that the ptalready has an appt at [facility] on 8/25. This is sooner than the Nov visit she had told Dr. Carlean Purl, and that would be earlier than our work-in appointment slots.    They are recommending for pt to keep appt at [facility] instead

## 2020-05-25 NOTE — Telephone Encounter (Signed)
Agree - at the time of visit did not have that scheduled.  Thanks Santiago Glad

## 2020-11-02 ENCOUNTER — Other Ambulatory Visit: Payer: Self-pay | Admitting: Gynecology

## 2020-11-02 DIAGNOSIS — N6099 Unspecified benign mammary dysplasia of unspecified breast: Secondary | ICD-10-CM

## 2020-11-09 ENCOUNTER — Ambulatory Visit (AMBULATORY_SURGERY_CENTER): Payer: BC Managed Care – PPO | Admitting: *Deleted

## 2020-11-09 ENCOUNTER — Other Ambulatory Visit: Payer: Self-pay

## 2020-11-09 VITALS — Ht 64.0 in | Wt 165.0 lb

## 2020-11-09 DIAGNOSIS — Z86010 Personal history of colonic polyps: Secondary | ICD-10-CM

## 2020-11-09 NOTE — Progress Notes (Signed)
Pt verified name, DOB, address and insurance during PV today. Pt mailed instruction packet to included paper to complete and mail back to [facility] with addressed and stamped envelope, Emmi video, copy of consent form to read and not return, and instructions.  PV completed over the phone. Pt encouraged to call with questions or issues   No egg or soy allergy known to patient  No issues with past sedation with any surgeries or procedures No intubation problems in the past  No FH of Malignant Hyperthermia No diet pills per patient No home 02 use per patient  No blood thinners per patient  Pt denies issues with constipation  No A fib or A flutter  EMMI video to pt or via MyChart  COVID 19 guidelines implemented in PV today with Pt and RN  Pt is fully vaccinated  for Covid   Due to the COVID-19 pandemic we are asking patients to follow certain guidelines.  Pt aware of COVID protocols and [facility] guidelines   

## 2020-11-21 ENCOUNTER — Other Ambulatory Visit: Payer: Self-pay

## 2020-11-21 ENCOUNTER — Ambulatory Visit (AMBULATORY_SURGERY_CENTER): Payer: BC Managed Care – PPO | Admitting: Internal Medicine

## 2020-11-21 ENCOUNTER — Encounter: Payer: Self-pay | Admitting: Internal Medicine

## 2020-11-21 VITALS — BP 91/63 | HR 62 | Temp 97.9°F | Resp 16 | Ht 64.0 in | Wt 165.0 lb

## 2020-11-21 DIAGNOSIS — Z86010 Personal history of colonic polyps: Secondary | ICD-10-CM

## 2020-11-21 DIAGNOSIS — Z1211 Encounter for screening for malignant neoplasm of colon: Secondary | ICD-10-CM | POA: Diagnosis not present

## 2020-11-21 MED ORDER — SODIUM CHLORIDE 0.9 % IV SOLN: 500.0000 mL | Freq: Once | INTRAVENOUS | Status: DC

## 2020-11-21 NOTE — Patient Instructions (Addendum)
Normal colonoscopy - no polyps or cancer seen.  Next routine colonoscopy or other screening test in 10 years - 2032.  I appreciate the opportunity to care for you. Gatha Mayer, MD, [facility]   Discharge instructions given. Normal exam. Resume previous medications. YOU HAD AN ENDOSCOPIC PROCEDURE TODAY AT [facility]:   Refer to the procedure report that was given to you for any specific questions about what was found during the examination.  If the procedure report does not answer your questions, please call your gastroenterologist to clarify.  If you requested that your care partner not be given the details of your procedure findings, then the procedure report has been included in a sealed envelope for you to review at your convenience later.  YOU SHOULD EXPECT: Some feelings of bloating in the abdomen. Passage of more gas than usual.  Walking can help get rid of the air that was put into your GI tract during the procedure and reduce the bloating. If you had a lower endoscopy (such as a colonoscopy or flexible sigmoidoscopy) you may notice spotting of blood in your stool or on the toilet paper. If you underwent a bowel prep for your procedure, you may not have a normal bowel movement for a few days.  Please Note:  You might notice some irritation and congestion in your nose or some drainage.  This is from the oxygen used during your procedure.  There is no need for concern and it should clear up in a day or so.  SYMPTOMS TO REPORT IMMEDIATELY:   Following lower endoscopy (colonoscopy or flexible sigmoidoscopy):  Excessive amounts of blood in the stool  Significant tenderness or worsening of abdominal pains  Swelling of the abdomen that is new, acute  Fever of 100F or higher   For urgent or emergent issues, a gastroenterologist can be reached at any hour by calling [phone]. Do not use MyChart messaging for urgent concerns.    DIET:  We do recommend a small meal  at first, but then you may proceed to your regular diet.  Drink plenty of fluids but you should avoid alcoholic beverages for 24 hours.  ACTIVITY:  You should plan to take it easy for the rest of today and you should NOT DRIVE or use heavy machinery until tomorrow (because of the sedation medicines used during the test).    FOLLOW UP: Our staff will call the number listed on your records 48-72 hours following your procedure to check on you and address any questions or concerns that you may have regarding the information given to you following your procedure. If we do not reach you, we will leave a message.  We will attempt to reach you two times.  During this call, we will ask if you have developed any symptoms of COVID 19. If you develop any symptoms (ie: fever, flu-like symptoms, shortness of breath, cough etc.) before then, please call [phone].  If you test positive for Covid 19 in the 2 weeks post procedure, please call and report this information to [place].    If any biopsies were taken you will be contacted by phone or by letter within the next 1-3 weeks.  Please call us at [phone] if you have not heard about the biopsies in 3 weeks.    SIGNATURES/CONFIDENTIALITY: You and/or your care partner have signed paperwork which will be entered into your electronic medical record.  These signatures attest to the fact that that the information above  on your After Visit Summary has been reviewed and is understood.  Full responsibility of the confidentiality of this discharge information lies with you and/or your care-partner.

## 2020-11-21 NOTE — Op Note (Signed)
[facility] Patient Name: Kristina Greene Procedure Date: 11/21/2020 8:26 AM MRN: [id number] Endoscopist: Gatha Mayer , MD Age: 60 Referring MD:  Date of Birth: June 03, 1961 Gender: Female Account #: [id number] Procedure:                Colonoscopy Indications:              Screening for colorectal malignant neoplasm Medicines:                Propofol per Anesthesia, Monitored Anesthesia Care Procedure:                Pre-Anesthesia Assessment:                           - Prior to the procedure, a History and Physical                            was performed, and patient medications and                            allergies were reviewed. [name] tolerance of                            previous anesthesia was also reviewed. The risks                            and benefits of the procedure and the sedation                            options and risks were discussed with the patient.                            All questions were answered, and informed consent                            was obtained. Prior Anticoagulants: The patient has                            taken no previous anticoagulant or antiplatelet                            agents. ASA Grade Assessment: III - A patient with                            severe systemic disease. After reviewing the risks                            and benefits, the patient was deemed in                            satisfactory condition to undergo the procedure.                           After obtaining informed consent, the colonoscope  was passed under direct vision. Throughout the                            procedure, the patient's blood pressure, pulse, and                            oxygen saturations were monitored continuously. The                            Olympus [id number]-[id number]) Colonoscope was                            introduced through the anus and advanced to the the                             cecum, identified by appendiceal orifice and                            ileocecal valve. The colonoscopy was somewhat                            difficult due to significant looping. Successful                            completion of the procedure was aided by applying                            abdominal pressure. The patient tolerated the                            procedure well. The quality of the bowel                            preparation was good. The ileocecal valve,                            appendiceal orifice, and rectum were photographed.                            The bowel preparation used was Miralax via split                            dose instruction. Scope In: 8:34:33 AM Scope Out: 8:51:41 AM Scope Withdrawal Time: 0 hours 11 minutes 22 seconds  Total Procedure Duration: 0 hours 17 minutes 8 seconds  Findings:                 The perianal and digital rectal examinations were                            normal.                           The entire examined colon appeared normal on direct  and retroflexion views. Complications:            No immediate complications. Estimated Blood Loss:     Estimated blood loss: none. Impression:               - The entire examined colon is normal on direct and                            retroflexion views.                           - No specimens collected. Recommendation:           - Patient has a contact number available for                            emergencies. The signs and symptoms of potential                            delayed complications were discussed with the                            patient. Return to normal activities tomorrow.                            Written discharge instructions were provided to the                            patient.                           - Resume previous diet.                           - Continue present medications.                           - Repeat  colonoscopy in 10 years for screening                            purposes. Gatha Mayer, MD 11/21/2020 9:02:51 AM This report has been signed electronically.

## 2020-11-21 NOTE — Progress Notes (Signed)
Report given to PACU, vss 

## 2020-11-21 NOTE — Progress Notes (Signed)
Pt's states no medical or surgical changes since previsit or office visit.   VS taken by CW 

## 2020-11-23 ENCOUNTER — Telehealth: Payer: Self-pay

## 2020-11-23 NOTE — Telephone Encounter (Signed)
  Follow up Call-  Call back number 11/21/2020  Post procedure Call Back phone  # [phone]  Permission to leave phone message Yes  Some recent data might be hidden     Patient questions:  Do you have a fever, pain , or abdominal swelling? No. Pain Score  0 *  Have you tolerated food without any problems? Yes.    Have you been able to return to your normal activities? Yes.    Do you have any questions about your discharge instructions: Diet   No. Medications  No. Follow up visit  No.  Do you have questions or concerns about your Care? No.  Actions: * If pain score is 4 or above: 1. No action needed, pain <4.Have you developed a fever since your procedure? no  2.   Have you had an respiratory symptoms (SOB or cough) since your procedure? no  3.   Have you tested positive for COVID 19 since your procedure no  4.   Have you had any family members/close contacts diagnosed with the COVID 19 since your procedure?  no   If yes to any of these questions please route to Joylene John, RN and Joella Prince, RN

## 2020-11-27 ENCOUNTER — Telehealth: Payer: Self-pay

## 2020-11-27 NOTE — Telephone Encounter (Signed)
Patient said she is having an uncomfortable anus pain since her colonoscopy, no fever. It's a 3 to 4 on a scale of 1-10. She thought you told her she had hemorrhoids and maybe that is the cause she said. Please advise Sir, thank you.

## 2020-11-28 MED ORDER — HYDROCORTISONE ACE-PRAMOXINE 1-1 % EX CREA: 1.0000 "application " | TOPICAL_CREAM | Freq: Two times a day (BID) | CUTANEOUS | 1 refills | Status: DC

## 2020-11-28 NOTE — Telephone Encounter (Signed)
I told Kristina Greene that Dr Carlean Purl sent in cream for her. She will call us back if any more problems. I mailed her a copy of her colon report per her request.

## 2020-11-28 NOTE — Telephone Encounter (Signed)
Tell her I am sorry to hear that but the scope moving through there can irritate things. I sent in a cream with some hydrocortisone and a pain reliever that she can use twice a day until she feels better. She applies it onto and into the anal canal.

## 2020-12-04 ENCOUNTER — Telehealth: Payer: Self-pay

## 2020-12-04 ENCOUNTER — Telehealth: Payer: Self-pay | Admitting: Internal Medicine

## 2020-12-04 NOTE — Telephone Encounter (Signed)
proctocream that was sent in was $180 and not covered by her insurance. She is advised to purchase OTC hydrocortisone BID and Recticare. PRN.  She is advised to also try SITZ baths BID.  She will call back for any additional questions or concerns.

## 2020-12-04 NOTE — Telephone Encounter (Signed)
Patients pramoxine-hydrocortisone cream is going to run $179.00 per the pharmacy. Her insurance doesn't cover it . Any other suggestions Sir, thank you.

## 2020-12-05 NOTE — Telephone Encounter (Signed)
OTC 5% lidocaine - generic recticare

## 2020-12-05 NOTE — Telephone Encounter (Signed)
Patient informed and said thank you. 

## 2021-02-22 ENCOUNTER — Telehealth: Payer: Self-pay | Admitting: Internal Medicine

## 2021-02-22 NOTE — Telephone Encounter (Signed)
Left message for patient to call back  

## 2021-02-22 NOTE — Telephone Encounter (Signed)
Inbound call from patient requesting to be scheduled an appointment for next week.  States she has been having issues after having her procedure in [place].  Informed patient next availability will be in June but was adamant about getting an appointment and does not want to see an APP.  Please advise.

## 2021-02-28 NOTE — Telephone Encounter (Signed)
Patient has an appointment tomorrow with Dr. Carlean Purl

## 2021-03-01 ENCOUNTER — Ambulatory Visit (INDEPENDENT_AMBULATORY_CARE_PROVIDER_SITE_OTHER): Payer: BC Managed Care – PPO | Admitting: Internal Medicine

## 2021-03-01 ENCOUNTER — Encounter: Payer: Self-pay | Admitting: Internal Medicine

## 2021-03-01 VITALS — BP 100/70 | HR 64 | Ht 63.5 in | Wt 174.0 lb

## 2021-03-01 DIAGNOSIS — K6289 Other specified diseases of anus and rectum: Secondary | ICD-10-CM | POA: Diagnosis not present

## 2021-03-01 DIAGNOSIS — R109 Unspecified abdominal pain: Secondary | ICD-10-CM

## 2021-03-01 DIAGNOSIS — R195 Other fecal abnormalities: Secondary | ICD-10-CM

## 2021-03-01 DIAGNOSIS — R935 Abnormal findings on diagnostic imaging of other abdominal regions, including retroperitoneum: Secondary | ICD-10-CM | POA: Diagnosis not present

## 2021-03-01 DIAGNOSIS — R102 Pelvic and perineal pain: Secondary | ICD-10-CM

## 2021-03-01 NOTE — Progress Notes (Signed)
Oasis Goehring 60 y.o. 1961/01/14 [id number]  Assessment & Plan:   Encounter Diagnoses  Name Primary?  . Rectal pain Yes  . Abdominal wall pain   . Loose stools   . 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 Pelvic pain in female    I am at a loss to explain all of this right now.  The abnormal MRI is not something I have ever seen before but I certainly think it is plausible that the trapped epicardial fat could be leading to some pain.  Pelvic pain also in the swelling and the rectal pain raise question of some sort of pelvic pathology.  I had thought perhaps she had a fissure but she does not.  My plan is to get an MRI of the abdomen and pelvis.  Hopefully we will get approval for the pelvic MRI as well as the abdominal MRI.  She will take her discs of her previous MRI to radiology when she goes today can compare.  Perhaps there has been some progression of this issue.  Trial of Benefiber or Metamucil for the stools.  Schedule follow-up for July with adjustments based upon the imaging in between.  If the pelvic MRI is not approved then she should proceed with the pelvic ultrasound but otherwise I think she can avoid a pelvic ultrasound as the MRI would be more accurate.  CC: Michell Heinrich, DO Dr. Kurtis Bushman    Subjective:   Chief Complaint: Abdominal pain rectal pain thigh swelling mushy stools  HPI Santiago Glad is here with her husband with several complaints.  She has had chronic recurrent epigastric and left upper quadrant pains.  She was worked up in [place] state and had an MRI after ultrasound suggested some type of left upper quadrant abdominal wall mass.  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  I did a colonoscopy on her for polyp surveillance in February and after that she had rectal discomfort and  pain.  She contacted [place] and I thought maybe it was just irritated and recommended she try hydrocortisone which she did but it did not really help.  She is describing an internal pressure in the rectum and pain and not pain consistent with a fissure i.e. not sharp or not with defecation.  She is also complaining of thigh swelling at times.  Stools have been mushy as well.  No bleeding.  She saw gynecology Dr. Sherre Lain  who did a pelvic exam that was normal and she is going to have a pelvic ultrasound.  Allergies  Allergen Reactions  . Molds & Smuts Itching, Swelling and Other (See Comments)    Other reaction(s): Cough (ALLERGY/intolerance)-- pT STATES MOLDS  Rhinorrhea and itchy eyes Rhinorrhea and itchy eyes   . Pollen Extract Other (See Comments)    Rhinorrhea and itchy eyes Rhinorrhea and itchy eyes Rhinorrhea and itchy eyes Rhinorrhea and itchy eyes   Current Meds  Medication Sig  . aspirin EC 81 MG tablet Take 81 mg by mouth daily. Swallow whole.  . Cholecalciferol (VITAMIN D-3 PO) Take 1 capsule by mouth daily.   . Coenzyme Q10 (COQ10) 100 MG CAPS Take 1 capsule by mouth daily.  Marland Kitchen levothyroxine (SYNTHROID) 75 MCG tablet Take 75 mcg by mouth daily before breakfast. NAME BRAND SYNTHROID 75 MG  .  liothyronine (CYTOMEL) 5 MCG tablet Take 5 mcg by mouth daily.  . Multiple Vitamin (MULTIVITAMIN) capsule Take 1 capsule by mouth daily.  Marland Kitchen omeprazole (PRILOSEC OTC) 20 MG tablet Take by mouth.  . rosuvastatin (CRESTOR) 10 MG tablet Take 10 mg by mouth daily.  . vitamin B-12 (CYANOCOBALAMIN) 1000 MCG tablet Take 1,000 mcg by mouth daily.   Past Medical History:  Diagnosis Date  . Allergy   . Gastritis    mild chronic  . GERD (gastroesophageal reflux disease)    occasionally  . Hepatic steatosis    without steatohepatitis  . History of thyroid cancer   . Hx of colonic polyps 08/11/2016   12 mm ssp 2017  . Hyperlipidemia   . Hypothyroidism   . Multiple allergies   . Pancreatitis     distant history  . Pituitary macroadenoma ([place])   . Stroke ([place])    SILENT TIA  STROKE BETWEEN 09-2018- 02-2020 PER MRI PER PT   . Thyroid cancer ([place])   . Thyroid disease    Past Surgical History:  Procedure Laterality Date  . APPENDECTOMY  1986  . [place] bunionectomy Left 06-21-2013  . BREAST BIOPSY  09/27/2012   Procedure: BREAST BIOPSY WITH NEEDLE LOCALIZATION;  Surgeon: Haywood Lasso, MD;  Location: [facility];  Service: General;  Laterality: Right;  . BREAST BIOPSY Right 05/08/2011  . BREAST EXCISIONAL BIOPSY Right 09/27/2012  . BUNIONECTOMY Right 09-13-2013  . COLONOSCOPY    . ESOPHAGOGASTRODUODENOSCOPY    . HAMMER TOE SURGERY Left    2nd, 3rd, 4th, 5th  . HAMMER TOE SURGERY Right 09-13-2013  . METATARSAL OSTEOTOMY Left 06-21-2013   5th  . POLYPECTOMY    . THYROIDECTOMY  2009  . TRANSPHENOIDAL PITUITARY RESECTION  02/2017  . UPPER GASTROINTESTINAL ENDOSCOPY     Social History   Social History Narrative   Originally from [place] in [place]   Married   + children   Has been employed at [employer]   No alcohol never smoker 3-4 caffeinated beverages daily   family history includes Colon cancer in her paternal uncle; Liver disease in her brother.   Review of Systems  As per HPI Objective:   Physical Exam BP 100/70 (BP Location: Left Arm, Patient Position: Sitting, Cuff Size: Normal)   Pulse 64   Ht 5' 3.5" (1.613 m)   Wt 174 lb (78.9 kg)   LMP 01/04/2013 Comment: menopausal  BMI 30.34 kg/m  Overweight-obese white woman no acute distress The abdomen is soft, she has tenderness in the left upper quadrant and some subcutaneous nodularities as well as tenderness in the subxiphoid area.  The ribs and the bony areas do not seem that tender.  She has a markedly positive Carnett's sign in these areas. She is tender bilaterally in the groins without hernia mass or lymph.  She has some groin pain with hip flexion on both sides but no think  remarkable and no significant pain with internal and external rotation of the hips.  Patti [place], [place] present.  Rectal NT with a very long anal canal,- no mass and no sign of fissure  Anoscopy reveals no significant abnormalities normal grade 1 internal and external hemorrhoids.  No fissure.

## 2021-03-01 NOTE — Patient Instructions (Signed)
You will be contacted by [facility] in the next 2 days to arrange a MRI abdomen and pelvis.  The number on your caller ID will be [phone], please answer when they call.  If you have not heard from them in 2 days please call [phone] to schedule.  PLEASE TAKE YOUR DISC TO YOUR APPOINTMENT.   Take either metamucil or benefiber as directed daily.   Follow up with Dr Carlean Purl in July.   I appreciate the opportunity to care for you. Silvano Rusk, MD, [facility]

## 2021-03-07 ENCOUNTER — Telehealth: Payer: Self-pay | Admitting: Internal Medicine

## 2021-03-07 NOTE — Telephone Encounter (Signed)
Spoke to Dr. Sabino Niemann for peer to peer on the patient's MRI scans.   Abdomen is approved   Pelvis is denied and withdrawn  Approval #[id number]  Please explained to the patient that she will need to get an ultrasound of the pelvis before they would authorize an MRI for that.  This is medically reasonable you can tell her I was just trying to get a look with the MRI since she was there.  We will need her to tell her gynecologist that she still needs the ultrasound of the pelvis.  Go ahead with the abdominal MRI as planned.

## 2021-03-08 NOTE — Telephone Encounter (Signed)
Patient's husband informed and said that [facility] has an U/S pelvis appointment set up for 03/14/2021 and he will have them fax [place] the report. I gave him the fax #.

## 2021-03-09 ENCOUNTER — Ambulatory Visit (HOSPITAL_COMMUNITY): Payer: BC Managed Care – PPO

## 2021-03-14 NOTE — Telephone Encounter (Signed)
Inbound call from patient. States she needs to know what is going on with her MRI abdomen and pelvis. States she had the U/S 5/26. Wants a call back [phone]

## 2021-03-14 NOTE — Telephone Encounter (Signed)
I spoke with Kristina Greene and she said they have cancelled her MRI of her abdomin and she wants to know why. I will check with [facility] tomorrow.

## 2021-03-15 ENCOUNTER — Telehealth: Payer: Self-pay | Admitting: Internal Medicine

## 2021-03-15 ENCOUNTER — Telehealth: Payer: Self-pay

## 2021-03-15 NOTE — Telephone Encounter (Signed)
I spoke with Kristina Greene and she is trying to send [place] the U/S report she had recently of her pelvis. She read it to me and it said unremarkable findings. Her MRI of her abdomen got cancelled by her insurance for some mix up in CPT codes is what I'm being told. She wants [place] to reorder MRI of abd/pelvis w/wo contrast if warranted after Dr Carlean Purl she's the ultrasound report.

## 2021-03-19 ENCOUNTER — Ambulatory Visit (HOSPITAL_COMMUNITY): Payer: BC Managed Care – PPO

## 2021-03-19 NOTE — Telephone Encounter (Signed)
Ultrasound report did come in through my chart and it was negative so I do not think she needs an MRI pelvis.  I spoke to [place] and we will go with MRI abdomen.

## 2021-03-20 NOTE — Telephone Encounter (Signed)
Patient has been rescheduled for 03/27/21 and she is aware

## 2021-03-27 ENCOUNTER — Ambulatory Visit
Admission: RE | Admit: 2021-03-27 | Discharge: 2021-03-27 | Disposition: A | Discharge status: Home / Self Care | Payer: Self-pay | Source: Ambulatory Visit | Attending: Internal Medicine | Admitting: Internal Medicine

## 2021-03-27 ENCOUNTER — Other Ambulatory Visit (HOSPITAL_COMMUNITY): Payer: Self-pay | Admitting: Internal Medicine

## 2021-03-27 ENCOUNTER — Other Ambulatory Visit: Payer: Self-pay

## 2021-03-27 ENCOUNTER — Ambulatory Visit (HOSPITAL_COMMUNITY)
Admission: RE | Admit: 2021-03-27 | Discharge: 2021-03-27 | Disposition: A | Discharge status: Home / Self Care | Payer: BC Managed Care – PPO | Source: Ambulatory Visit | Attending: Internal Medicine | Admitting: Internal Medicine

## 2021-03-27 DIAGNOSIS — R1084 Generalized abdominal pain: Secondary | ICD-10-CM

## 2021-03-27 DIAGNOSIS — R102 Pelvic and perineal pain: Secondary | ICD-10-CM | POA: Diagnosis present

## 2021-03-27 DIAGNOSIS — R935 Abnormal findings on diagnostic imaging of other abdominal regions, including retroperitoneum: Secondary | ICD-10-CM

## 2021-03-27 DIAGNOSIS — R109 Unspecified abdominal pain: Secondary | ICD-10-CM | POA: Diagnosis not present

## 2021-03-27 IMAGING — MR MR ABDOMEN WO/W CM
18 series · 48 of 48 positions shown · IV contrast (8 ML GADAVIST)
Comparison: Outside MRI [DATE] (no report)
COMPARISON: Outside MRI [DATE] (no report)

Addendum:
CLINICAL DATA: Abdominal pain nonlocalized.

EXAM:
MRI ABDOMEN WITHOUT AND WITH CONTRAST
TECHNIQUE: Multiplanar multisequence MR imaging of the abdomen was performed
both before and after the administration of intravenous contrast.
CONTRAST:  7mL GADAVIST GADOBUTROL 1 MMOL/ML IV SOLN

[Series 3: T2 · coronal · 6.0mm · 1.56mm/px · 2 of 30 slices shown (1 of 2)]
[im 1/30]
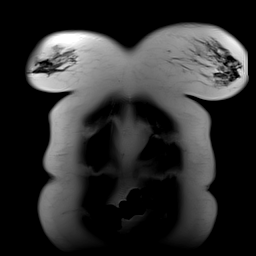
[im 30/30]
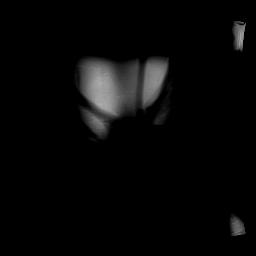

[Series 5: T2 fat-sat · axial · 6.0mm · 1.25mm/px · z∈[-221,+52]mm · 3 of 39 slices shown]
[im 1/39]
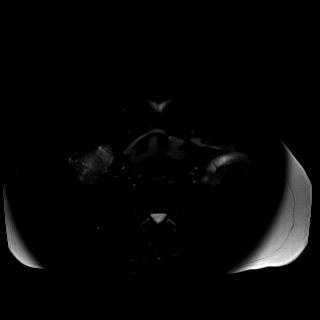
[im 20/39]
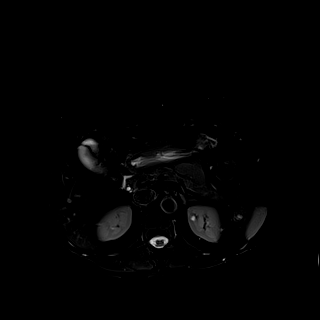
[im 39/39]
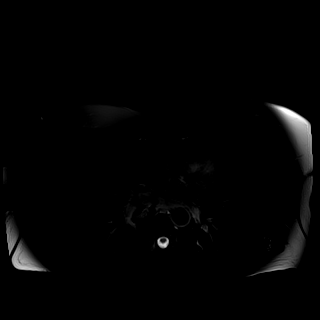

[Series 6: T1 · axial · 3.0mm · 1.25mm/px · z∈[-234,+27]mm · 4 of 88 slices shown (1 of 2)]
[im 1/88]
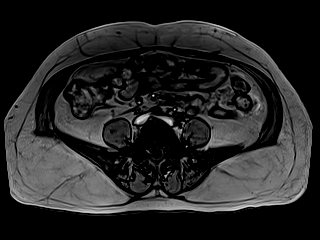
[im 30/88]
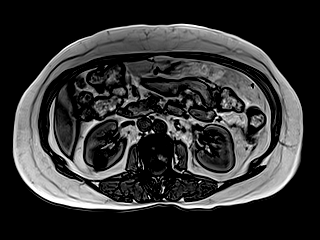
[im 59/88]
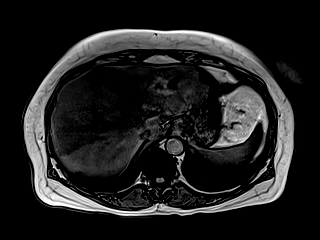
[im 88/88]
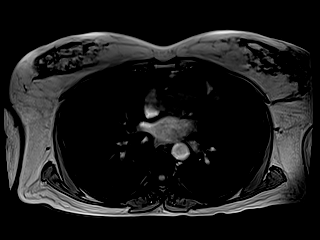

[Series 7: T1 · axial · 3.0mm · 1.25mm/px · z∈[-234,+27]mm · 3 of 88 slices shown (2 of 2)]
[im 1/88]
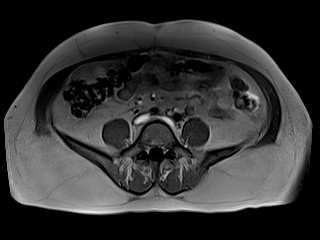
[im 44/88]
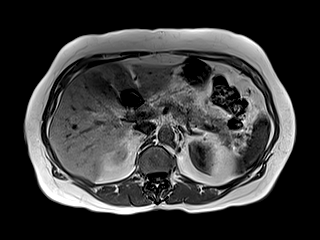
[im 88/88]
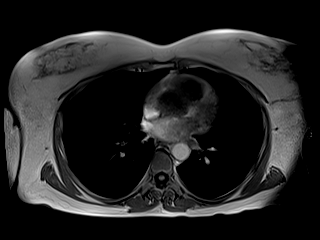

[Series 8: DWI · axial · 6.0mm · 1.49mm/px · z∈[-228,+38]mm · 3 of 76 slices shown (1 of 2)]
[im 1/76]
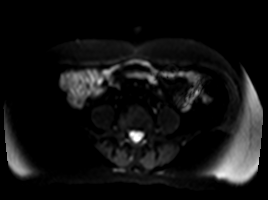
[im 38/76]
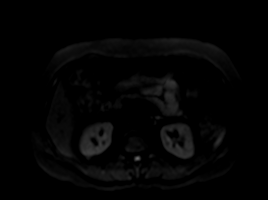
[im 76/76]
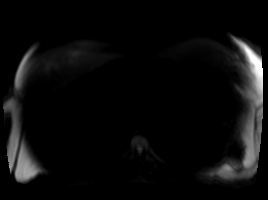

[Series 9: DWI · axial · 6.0mm · 1.49mm/px · 1 of 38 slices shown (2 of 2)]
[im 1/38]
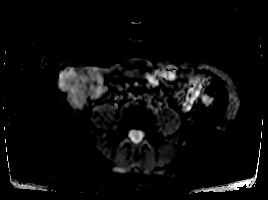

[Series 10: bSSFP · axial · 4.0mm · 0.84mm/px · z∈[-212,+24]mm · 2 of 60 slices shown]
[im 1/60]
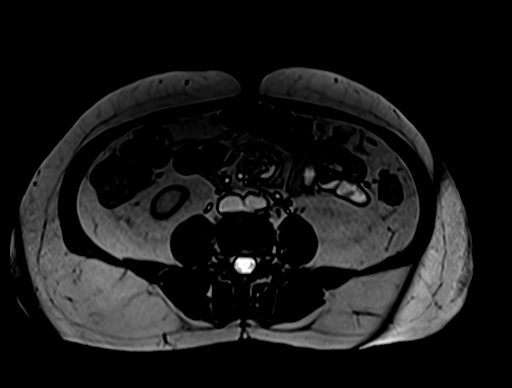
[im 60/60]
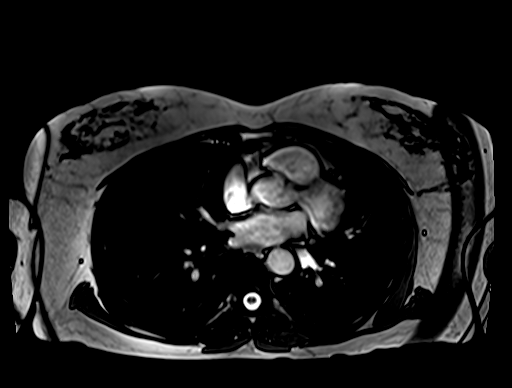

[Series 12: T1 dynamic · axial · 3.0mm · 1.25mm/px · z∈[-212,+25]mm · 3 of 80 slices shown (1 of 10)]
[im 1/80]
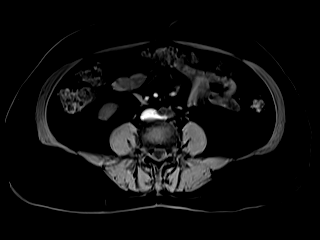
[im 40/80]
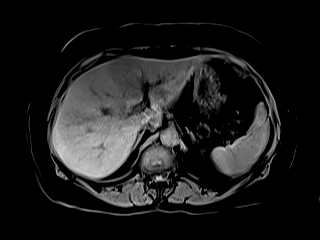
[im 80/80]
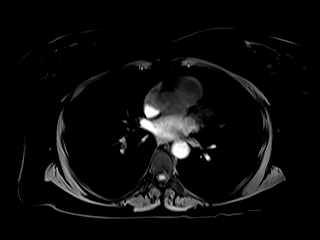

[Series 16: T1 dynamic · axial · 3.0mm · 1.25mm/px · z∈[-212,+25]mm · 3 of 80 slices shown (2 of 10)]
[im 1/80]
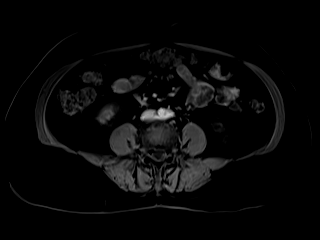
[im 40/80]
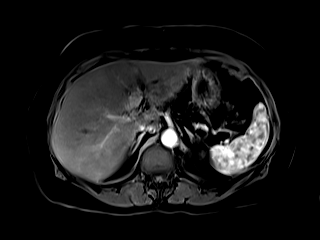
[im 80/80]
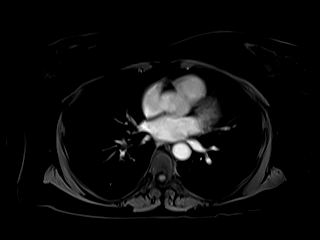

[Series 17: T1 dynamic · axial · 3.0mm · 1.25mm/px · z∈[-212,+25]mm · 3 of 80 slices shown (3 of 10)]
[im 1/80]
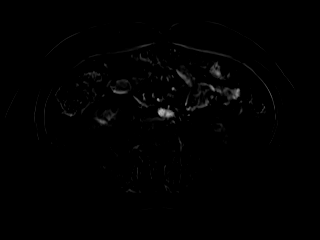
[im 40/80]
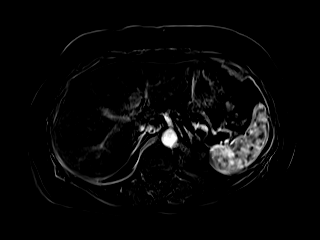
[im 80/80]
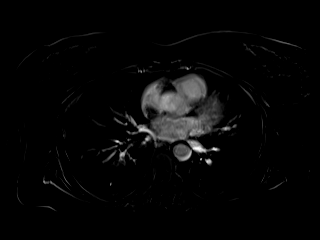

[Series 20: T1 dynamic · axial · 3.0mm · 1.25mm/px · z∈[-212,+25]mm · 3 of 80 slices shown (4 of 10)]
[im 1/80]
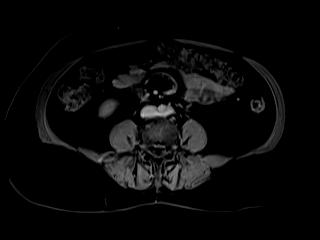
[im 40/80]
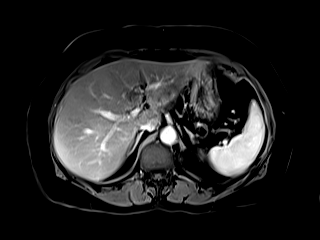
[im 80/80]
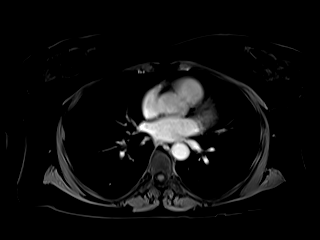

[Series 21: T1 dynamic · axial · 3.0mm · 1.25mm/px · z∈[-212,+25]mm · 3 of 80 slices shown (5 of 10)]
[im 1/80]
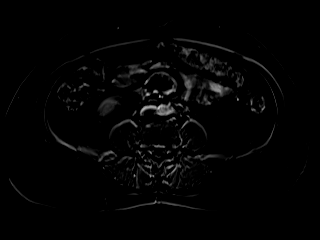
[im 40/80]
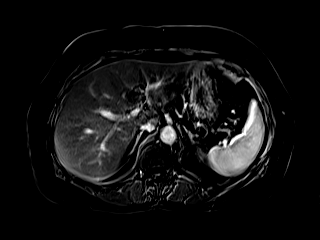
[im 80/80]
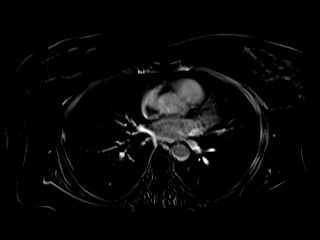

[Series 24: T1 dynamic · axial · 3.0mm · 1.25mm/px · z∈[-212,+25]mm · 3 of 80 slices shown (6 of 10)]
[im 1/80]
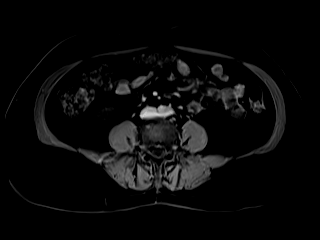
[im 40/80]
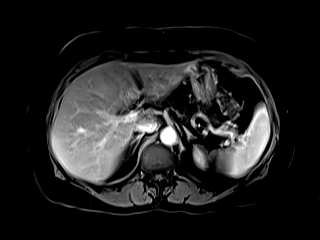
[im 80/80]
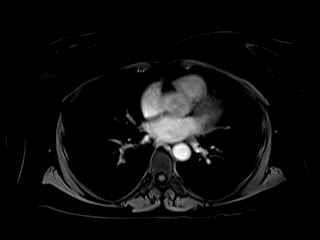

[Series 25: T1 dynamic · axial · 3.0mm · 1.25mm/px · z∈[-212,+25]mm · 3 of 80 slices shown (7 of 10)]
[im 1/80]
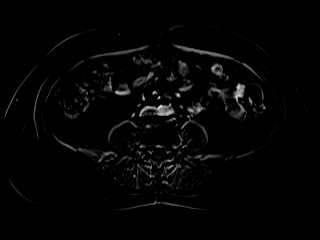
[im 40/80]
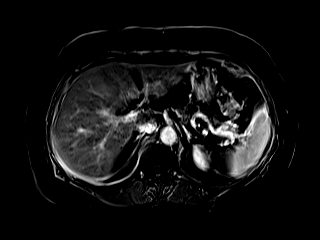
[im 80/80]
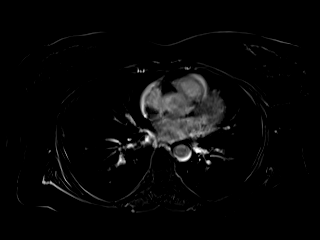

[Series 27: T1 dynamic · coronal · 5.0mm · 1.41mm/px · 2 of 56 slices shown (8 of 10)]
[im 1/56]
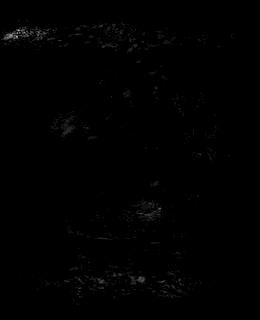
[im 56/56]
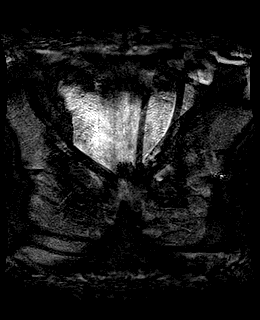

[Series 28: T2 · axial · 6.0mm · 1.56mm/px · 1 of 35 slices shown (2 of 2)]
[im 1/35]
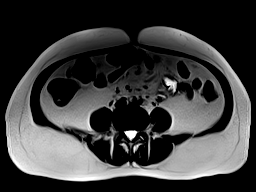

[Series 31: T1 dynamic · axial · 3.0mm · 1.25mm/px · z∈[-212,+25]mm · 3 of 80 slices shown (9 of 10)]
[im 1/80]
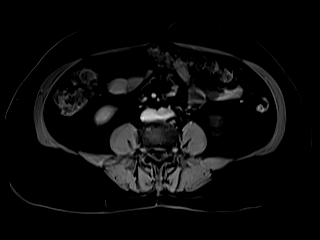
[im 40/80]
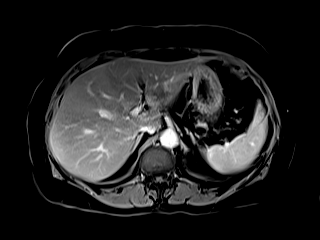
[im 80/80]
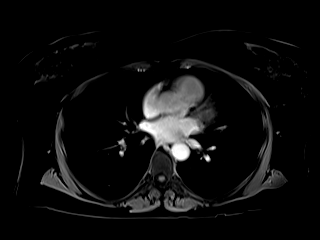

[Series 32: T1 dynamic · axial · 3.0mm · 1.25mm/px · z∈[-212,+25]mm · 3 of 80 slices shown (10 of 10)]
[im 1/80]
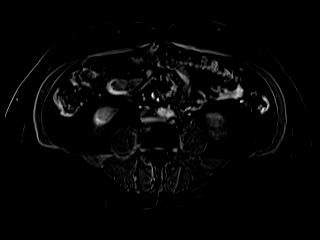
[im 40/80]
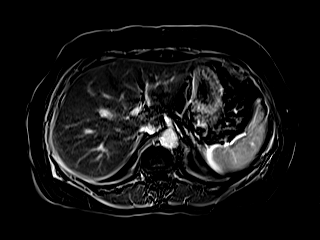
[im 80/80]
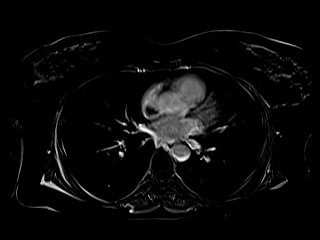

[48 of 48 positions shown; findings below may reference images not displayed]

FINDINGS: Lower chest:  Lung bases are clear.

Hepatobiliary: Uniform loss of signal intensity in the liver
parenchyma on out of phase imaging consistent with diffuse hepatic
steatosis (series 6). There is no biliary duct dilatation. Common
bile duct normal caliber. Gallbladder normal with a Phrygian cap. No
gallstones.

Very small round lesion in the caudate lobe measures 8 mm (image
12/series 5) and has high signal intensity T2 weighted imaging. This
lesion demonstrates early and persistent postcontrast enhancement
(image 28/series 20). Lesion may be slightly increased from 6 mm on
outside MRI [DATE] (no report available).

Pancreas: Normal pancreatic parenchymal intensity. No ductal
dilatation or inflammation.

Spleen: Normal spleen.

Adrenals/urinary tract: Adrenal glands normal. Nonenhancing cyst of
the RIGHT kidney.

Stomach/Bowel: Stomach and limited of the small bowel is
unremarkable

Vascular/Lymphatic: Abdominal aortic normal caliber. No
retroperitoneal periportal lymphadenopathy.

Musculoskeletal: No aggressive osseous lesion
IMPRESSION: 1. Hepatic steatosis.
2. Small (8 mm) probable hemangioma in the caudate lobe. Lesion
minimally increased in size from [UP].
3. Normal biliary tree, gallbladder and extrahepatic ducts
4. Normal pancreas.
5. Benign RIGHT renal cyst

ADDENDUM:
In comparison to MRI [DATE], previous described epicardial fat
extending along the anterior surface of the LEFT diaphragm is
unchanged in size and represents a benign finding.

The small hepatic hemangioma and benign RIGHT renal cyst while not
described on comparison MRI are not significantly in the 3 year
interval.

No worrisome findings on current MRI.

*** End of Addendum ***
FINDINGS: Lower chest:  Lung bases are clear.

Hepatobiliary: Uniform loss of signal intensity in the liver
parenchyma on out of phase imaging consistent with diffuse hepatic
steatosis (series 6). There is no biliary duct dilatation. Common
bile duct normal caliber. Gallbladder normal with a Phrygian cap. No
gallstones.

Very small round lesion in the caudate lobe measures 8 mm (image
12/series 5) and has high signal intensity T2 weighted imaging. This
lesion demonstrates early and persistent postcontrast enhancement
(image 28/series 20). Lesion may be slightly increased from 6 mm on
outside MRI [DATE] (no report available).

Pancreas: Normal pancreatic parenchymal intensity. No ductal
dilatation or inflammation.

Spleen: Normal spleen.

Adrenals/urinary tract: Adrenal glands normal. Nonenhancing cyst of
the RIGHT kidney.

Stomach/Bowel: Stomach and limited of the small bowel is
unremarkable

Vascular/Lymphatic: Abdominal aortic normal caliber. No
retroperitoneal periportal lymphadenopathy.

Musculoskeletal: No aggressive osseous lesion
IMPRESSION: 1. Hepatic steatosis.
2. Small (8 mm) probable hemangioma in the caudate lobe. Lesion
minimally increased in size from [UP].
3. Normal biliary tree, gallbladder and extrahepatic ducts
4. Normal pancreas.
5. Benign RIGHT renal cyst

## 2021-03-27 MED ORDER — GADOBUTROL 1 MMOL/ML IV SOLN
7.0000 mL | Freq: Once | INTRAVENOUS | Status: AC | PRN
  Administered 2021-03-27: 7 mL via INTRAVENOUS

## 2021-05-07 ENCOUNTER — Ambulatory Visit (INDEPENDENT_AMBULATORY_CARE_PROVIDER_SITE_OTHER): Payer: BC Managed Care – PPO | Admitting: Internal Medicine

## 2021-05-07 ENCOUNTER — Encounter: Payer: Self-pay | Admitting: Internal Medicine

## 2021-05-07 VITALS — BP 112/77 | HR 76 | Ht 64.0 in | Wt 171.2 lb

## 2021-05-07 DIAGNOSIS — D1803 Hemangioma of intra-abdominal structures: Secondary | ICD-10-CM | POA: Diagnosis not present

## 2021-05-07 DIAGNOSIS — K760 Fatty (change of) liver, not elsewhere classified: Secondary | ICD-10-CM

## 2021-05-07 DIAGNOSIS — R102 Pelvic and perineal pain: Secondary | ICD-10-CM | POA: Diagnosis not present

## 2021-05-07 DIAGNOSIS — N281 Cyst of kidney, acquired: Secondary | ICD-10-CM

## 2021-05-07 DIAGNOSIS — K6289 Other specified diseases of anus and rectum: Secondary | ICD-10-CM

## 2021-05-07 NOTE — Progress Notes (Signed)
Kristina Greene 60 y.o. 30-Dec-1960 [id number]  Assessment & Plan:   Encounter Diagnoses  Name Primary?   NAFLD (nonalcoholic fatty liver disease) Yes   Pelvic pain    Hepatic hemangioma    Renal cyst    Rectal pain      Extended discussion about these issues reassured about the hemangioma and renal cyst.  Regarding her pelvic and rectal pain will refer to pelvic floor physical therapy here in [place].   Regarding nonalcoholic fatty liver disease the following:  Low-carb diet recommendations we reviewed macronutrient handout provided.  Agree with Mediterranean diet in conjunction with this.  However I do not agree with the neurologist recommendation to reduce or eliminate cream, butter. Per discussion with patient and husband's input it sounds like there is significant bread consumption and I would definitely greatly reduce that.  I had previously recommended to try intermittent fasting and/or low-carb diets.  She remembers.   Orders Placed This Encounter  Procedures   Insulin, random   Lipoprotein Analysis by NMR   Ambulatory referral to Physical Therapy    She will send me copies of the labs she has at her primary care appointment coming up as well. I will see her back in 3 months.  She reminds me that she has been tested for celiac disease in the past and it was negative.  I appreciate the opportunity to care for this patient. CC: Kristina Heinrich, DO   Subjective:   Chief Complaint: Rectal and pelvic pain, MRI result review  HPI Kristina Greene is here with her husband to review several things.  She had an MRI of the abdomen as below.  I have looked at the images in the past and discussed previously with her as well.  We had an addendum issued.  She asked a lot of questions about the renal cyst and the steatosis and the hemangioma.  I reviewed those.  She continues with upper thigh swelling and she has had pelvic pain and rectal pain and intense pain making her think she might  of had a UTI recently.  She had a urinalysis at an urgent care with 1 red blood cell per high-power field and small amount of blood.  She is considering going to urologist.  She had a pelvic ultrasound see my last note at the direction of her gynecologist and it was reassuring and unremarkable.  There were 2 small fibroids that are stable uterus normal with normal endometrial thickness no fluid and appropriately sized ovaries.  Apparently she eats bread frequently.  She does not have sugary sweetened beverages.  No significant alcohol that I am aware of.  Generally does not eat processed food outside of bread rare pasta She was at [facility] for [facility]y follow-up in June.  She tells me she was told to use a Mediterranean diet and increase her statin.  Below was taken from the [facility] note Mediterranean Diet Recommendations  Olive oil in cooking and dressing dishes. More than 2 servings of vegetables per day, at least one of them raw (salad). Not counting side dishes. More than 2-3 servings of fresh fruits (including natural juice) daily. More than 3 servings per week of legumes. More than 3 servings of fish or seafood (at least one serving of fatty fish). More than one weekly serving of nuts or seeds. Select white meats instead of red or processed meats. Cook regularly (at least twice a week) with tomato, garlic, and onion adding or not other aromatic herbs, and  use these as dressing ("sofrito" simmering garlic, onion and tomato in olive oil) for other meals (vegetables, pasta, rice).  Eliminate or limit the following: Cream. Butter Margarine. Cold Meat. Haze [place]. Carbonated and/or sugared beverages. Pastries. Industrial bakery products (cakes, donuts, cookies). Industrial desserts. [place] fries, potato chips and sweets.  Other instructions: For usual drinkers: wine, maximum 1 glass per day for women and 2 glasses per day for men. Recommended: 136mL (5 oz = 2 glasses) and  123mL (3.4 oz = 1 glass) per day respectively for men and women. Unlimited: nuts (raw-unsalted)(eaten at any time except after dinner), eggs, fish, seafood, low fat cheese, dark chocolate (>50% cocoa), and whole grain cereals.  Limited consumption (<1 serving per week) Cured ham. Red meat (removing all visible fat). Cured or fatty cheeses.      MR Abdomen W Wo Contrast Addendum: ADDENDUM REPORT: 04/09/2021 13:18   ADDENDUM:  In comparison to MRI 04/13/2018, previous described epicardial fat  extending along the anterior surface of the LEFT diaphragm is  unchanged in size and represents a benign finding.   The small hepatic hemangioma and benign RIGHT renal cyst while not  described on comparison MRI are not significantly in the 3 year  interval.   No worrisome findings on current MRI.   Electronically Signed    By: Suzy Bouchard M.D.    On: 04/09/2021 13:18 Narrative: CLINICAL DATA:  Abdominal pain nonlocalized.  EXAM: MRI ABDOMEN WITHOUT AND WITH CONTRAST  TECHNIQUE: Multiplanar multisequence MR imaging of the abdomen was performed both before and after the administration of intravenous contrast.  CONTRAST:  39mL GADAVIST GADOBUTROL 1 MMOL/ML IV SOLN  COMPARISON:  Outside MRI 04/08/2018 (no report)  FINDINGS: Lower chest:  Lung bases are clear.  Hepatobiliary: Uniform loss of signal intensity in the liver parenchyma on out of phase imaging consistent with diffuse hepatic steatosis (series 6). There is no biliary duct dilatation. Common bile duct normal caliber. Gallbladder normal with a Phrygian cap. No gallstones.  Very small round lesion in the caudate lobe measures 8 mm (image 12/series 5) and has high signal intensity T2 weighted imaging. This lesion demonstrates early and persistent postcontrast enhancement (image 28/series 20). Lesion may be slightly increased from 6 mm on outside MRI 04/13/2018 (no report available).  Pancreas: Normal pancreatic  parenchymal intensity. No ductal dilatation or inflammation.  Spleen: Normal spleen.  Adrenals/urinary tract: Adrenal glands normal. Nonenhancing cyst of the RIGHT kidney.  Stomach/Bowel: Stomach and limited of the small bowel is unremarkable  Vascular/Lymphatic: Abdominal aortic normal caliber. No retroperitoneal periportal lymphadenopathy.  Musculoskeletal: No aggressive osseous lesion  IMPRESSION: 1. Hepatic steatosis. 2. 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 Lesion minimally increased in size from 2019. 3. Normal biliary tree, gallbladder and extrahepatic ducts 4. Normal pancreas. 5. Benign RIGHT renal cyst  Electronically Signed: By: Suzy Bouchard M.D. On: 03/27/2021 15:37   Wt Readings from Last 3 Encounters:  05/07/21 171 lb 3.2 oz (77.7 kg)  03/01/21 174 lb (78.9 kg)  11/21/20 165 lb (74.8 kg)    Allergies  Allergen Reactions   Molds & Smuts Itching, Swelling and Other (See Comments)    Other reaction(s): Cough (ALLERGY/intolerance)-- pT STATES MOLDS  Rhinorrhea and itchy eyes Rhinorrhea and itchy eyes    Pollen Extract Other (See Comments)    Rhinorrhea and itchy eyes Rhinorrhea and itchy eyes Rhinorrhea and itchy eyes Rhinorrhea and itchy eyes   Current Meds  Medication Sig   aspirin EC 81 MG tablet  Take 81 mg by mouth daily. Swallow whole.   Cholecalciferol (VITAMIN D-3 PO) Take 1 capsule by mouth daily.    Coenzyme Q10 (COQ10) 100 MG CAPS Take 1 capsule by mouth daily.   levofloxacin (LEVAQUIN) 500 MG tablet Take 500 mg by mouth daily. 5 days course for UTI   levothyroxine (SYNTHROID) 75 MCG tablet Take 75 mcg by mouth daily before breakfast. NAME BRAND SYNTHROID 75 MG   liothyronine (CYTOMEL) 5 MCG tablet Take 5 mcg by mouth daily.   Multiple Vitamin (MULTIVITAMIN) capsule Take 1 capsule by mouth daily.   omeprazole (PRILOSEC OTC) 20 MG tablet Take 20 mg by mouth. As needed   rosuvastatin (CRESTOR) 10 MG tablet Take 10 mg by  mouth daily.   vitamin B-12 (CYANOCOBALAMIN) 1000 MCG tablet Take 1,000 mcg by mouth daily.   Past Medical History:  Diagnosis Date   Allergy    Gastritis    mild chronic   GERD (gastroesophageal reflux disease)    occasionally   Hepatic steatosis    without steatohepatitis   History of thyroid cancer    Hx of colonic polyps 08/11/2016   12 mm ssp 2017   Hyperlipidemia    Hypothyroidism    Multiple allergies    Pancreatitis    distant history   Pituitary macroadenoma ([place])    Stroke ([place])    SILENT TIA  STROKE BETWEEN 09-2018- 02-2020 PER MRI PER PT    Thyroid cancer ([place])    Thyroid disease    Past Surgical History:  Procedure Laterality Date   APPENDECTOMY  1986   [place] bunionectomy Left 06-21-2013   BREAST BIOPSY  09/27/2012   Procedure: BREAST BIOPSY WITH NEEDLE LOCALIZATION;  Surgeon: Haywood Lasso, MD;  Location: [facility];  Service: General;  Laterality: Right;   BREAST BIOPSY Right 05/08/2011   BREAST EXCISIONAL BIOPSY Right 09/27/2012   BUNIONECTOMY Right 09-13-2013   COLONOSCOPY     ESOPHAGOGASTRODUODENOSCOPY     HAMMER TOE SURGERY Left    2nd, 3rd, 4th, 5th   HAMMER TOE SURGERY Right 09-13-2013   METATARSAL OSTEOTOMY Left 06-21-2013   5th   POLYPECTOMY     THYROIDECTOMY  2009   TRANSPHENOIDAL PITUITARY RESECTION  02/2017   UPPER GASTROINTESTINAL ENDOSCOPY     Social History   Social History Narrative   Originally from [place] in [place]   Married   + children   Has been employed at [employer]   No alcohol never smoker 3-4 caffeinated beverages daily   family history includes Colon cancer in her paternal uncle; Liver disease in her brother.   Review of Systems As per HPI  Objective:   Physical Exam BP 112/77   Pulse 76   Ht 5\' 4"  (1.626 m)   Wt 171 lb 3.2 oz (77.7 kg)   LMP 01/04/2013 Comment: menopausal  SpO2 95%   BMI 29.39 kg/m   Total time on the visit before during and after is 45 minutes

## 2021-05-07 NOTE — Patient Instructions (Signed)
If you are age 60 or older, your body mass index should be between 23-30. Your Body mass index is 29.39 kg/m. If this is out of the aforementioned range listed, please consider follow up with your Primary Care Provider.  If you are age 43 or younger, your body mass index should be between 19-25. Your Body mass index is 29.39 kg/m. If this is out of the aformentioned range listed, please consider follow up with your Primary Care Provider.   __________________________________________________________  The [place] [facility] providers would like to encourage you to use [facility] to communicate with providers for non-urgent requests or questions.  Due to long hold times on the telephone, sending your provider a message by [facility] may be a faster and more efficient way to get a response.  Please allow 48 business hours for a response.  Please remember that this is for non-urgent requests.   We are providing you with printed lab orders to take and have your blood drawn fasting.  Due to recent changes in healthcare laws, you may see the results of your imaging and laboratory studies on MyChart before your provider has had a chance to review them.  We understand that in some cases there may be results that are confusing or concerning to you. Not all laboratory results come back in the same time frame and the provider may be waiting for multiple results in order to interpret others.  Please give [place] 48 hours in order for your provider to thoroughly review all the results before contacting the office for clarification of your results.      Follow up with Dr Carlean Purl in 3 months.  I appreciate the opportunity to care for you. Silvano Rusk, MD, [facility]

## 2021-05-16 ENCOUNTER — Other Ambulatory Visit: Payer: Self-pay

## 2021-05-16 ENCOUNTER — Encounter: Payer: Self-pay | Admitting: Physical Therapy

## 2021-05-16 ENCOUNTER — Ambulatory Visit: Payer: BC Managed Care – PPO | Attending: Internal Medicine | Admitting: Physical Therapy

## 2021-05-16 DIAGNOSIS — R279 Unspecified lack of coordination: Secondary | ICD-10-CM | POA: Insufficient documentation

## 2021-05-16 DIAGNOSIS — R2689 Other abnormalities of gait and mobility: Secondary | ICD-10-CM | POA: Diagnosis present

## 2021-05-16 DIAGNOSIS — M6281 Muscle weakness (generalized): Secondary | ICD-10-CM | POA: Diagnosis present

## 2021-05-16 NOTE — Patient Instructions (Addendum)
  Lubrication Used for intercourse to reduce friction Avoid ones that have glycerin, warming gels, tingling gels, icing or cooling gel, scented Avoid parabens due to a preservative similar to female sex hormone May need to be reapplied once or several times during sexual activity Can be applied to both partners genitals prior to vaginal penetration to minimize friction or irritation Prevent irritation and mucosal tears that cause post coital pain and increased the risk of vaginal and urinary tract infections Oil-based lubricants cannot be used with condoms due to breaking them down.  Least likely to irritate vaginal tissue.  Plant based-lubes are safe Silicone-based lubrication are thicker and last long and used for post-menopausal women  Vaginal Lubricators Here is a list of some suggested lubricators you can use for intercourse. Use the most hypoallergenic product.  You can place on you or your partner.  Slippery Stuff ( water based) Sylk or Sliquid Natural H2O ( good  if frequent UTI's)- walmart, amazon Sliquid organics silk-(aloe and silicone based ) [employer] (www.blossom-organics.com)- (aloe based ) Coconut oil, olive oil -not good with condoms  PJur Woman Nude- (water based) amazon Uberlube- ( silicon) [place] has an organic one Yes lubricant- (water based and has plant oil based similar to silicone) [employer] Platinum-Silicone, Target, [facility] Olive and Bee intimate cream-  www.oliveandbee.com.au Pink - [employer] stuff Erosense Sync- walmart, amazon Coconu- FailLinks.co.uk  Things to avoid in lubricants are glycerin, warming gels, tingling gels, icing or cooling  gels, and scented gels.  Also avoid Vaseline. KY jelly, Replens, and Astroglide contain chlorhexidine which kills good bacteria(lactobacilli)  Things to avoid in the vaginal area Do not use things to irritate the vulvar area No lotions- see below Soaps you  can use :Aveeno, [place], Good  Clean Love cleanser if needed. Must be gentle No deodorants No douches Good to sleep without underwear to let the vaginal area to air out No scrubbing: spread the lips to let warm water rinse over labias and pat dry  Creams that can be used on the Vulva Area V [employer], [place] MoonMaid Botanical Pro-Meno Wild Yam Cream Coconut oil, olive oil Cleo by [employer] labial moisturizer -Amazon,  Desert [place] Releveum ( lidocaine) or Desert [employer] Yes Moisturizer      Access Code: [email or web address] URL: [email or web address] Date: 05/16/2021 Prepared by: [facility]  Exercises Supine Diaphragmatic Breathing - 1 x daily - 7 x weekly - 1 sets - 10 reps Seated Hamstring Stretch - 1 x daily - 7 x weekly - 1 sets - 2-5 reps - 45s hold Supine Hip Adductor Stretch - 1 x daily - 7 x weekly - 1 sets - 2-5 reps - 45 hold Supine Pelvic Floor Stretch - 1 x daily - 7 x weekly - 1 sets - 2-5 reps - 45 hold Diaphragmatic Breathing in Child's Pose with Pelvic Floor Relaxation - 1 x daily - 7 x weekly - 1 sets - 2-5 reps - 45s hold Hooklying Transversus Abdominis Palpation - 1 x daily - 7 x weekly - 1 sets - 10 reps - 2-3s holds Sidelying Thoracic Rotation with Open Book - 1 x daily - 7 x weekly - 1 sets - 3-5 reps - 30-45s holds  [facility] [address], [place], [place] [address] Phone # [phone] Fax [phone]

## 2021-05-16 NOTE — Therapy (Signed)
[facility] [address], [place], [place], [address] Phone: [phone]   Fax:  [phone]  Physical Therapy Evaluation  Patient Details  Name: Kristina Greene MRN: [id number] Date of Birth: 1961-02-07 Referring Provider (PT): Gatha Mayer, MD   Encounter Date: 05/16/2021   PT End of Session - 05/16/21 1238     Visit Number 1    Authorization Type BCBS    PT Start Time [id number]    PT Stop Time [id number]    PT Time Calculation (min) 45 min    Activity Tolerance Patient tolerated treatment well;Patient limited by pain    Behavior During Therapy [facility] for tasks assessed/performed             Past Medical History:  Diagnosis Date   Allergy    Gastritis    mild chronic   GERD (gastroesophageal reflux disease)    occasionally   Hepatic steatosis    without steatohepatitis   History of thyroid cancer    Hx of colonic polyps 08/11/2016   12 mm ssp 2017   Hyperlipidemia    Hypothyroidism    Multiple allergies    Pancreatitis    distant history   Pituitary macroadenoma ([place])    Stroke ([place])    SILENT TIA  STROKE BETWEEN 09-2018- 02-2020 PER MRI PER PT    Thyroid cancer ([place])    Thyroid disease     Past Surgical History:  Procedure Laterality Date   APPENDECTOMY  1986   [place] bunionectomy Left 06-21-2013   BREAST BIOPSY  09/27/2012   Procedure: BREAST BIOPSY WITH NEEDLE LOCALIZATION;  Surgeon: Haywood Lasso, MD;  Location: [place];  Service: General;  Laterality: Right;   BREAST BIOPSY Right 05/08/2011   BREAST EXCISIONAL BIOPSY Right 09/27/2012   BUNIONECTOMY Right 09-13-2013   COLONOSCOPY     ESOPHAGOGASTRODUODENOSCOPY     HAMMER TOE SURGERY Left    2nd, 3rd, 4th, 5th   HAMMER TOE SURGERY Right 09-13-2013   METATARSAL OSTEOTOMY Left 06-21-2013   5th   POLYPECTOMY     THYROIDECTOMY  2009   TRANSPHENOIDAL PITUITARY RESECTION  02/2017   UPPER GASTROINTESTINAL ENDOSCOPY      There  were no vitals filed for this visit.    Subjective Assessment - 05/16/21 1154     Subjective Pt reports internal pelvic pain that radiates into the Rt hip and thinks her Rt anterior pelvic area is swollen and has gained 10lbs. Pt reports being followed by MD for renal cysts and urterine fibroids.    Currently in Pain? Yes    Pain Score 6     Pain Location Pelvis    Pain Orientation Right    Pain Type Chronic pain    Pain Radiating Towards Rt hip    Pain Onset More than a month ago    Pain Frequency Constant    Aggravating Factors  pt reports it doesn't matter she is standing or walking or sitting    Pain Relieving Factors ibuprofen intermittently                [facility] PT Assessment - 05/16/21 0001       Assessment   Medical Diagnosis R10.2 (ICD-10-CM) - Pelvic pain    Referring Provider (PT) Gatha Mayer, MD    Onset Date/Surgical Date --   may 2022   Prior Therapy not for pelvic floor      Precautions   Precautions None  Restrictions   Weight Bearing Restrictions No      Balance Screen   Has the patient fallen in the past 6 months No    Has the patient had a decrease in activity level because of a fear of falling?  No    Is the patient reluctant to leave their home because of a fear of falling?  No      Home Ecologist residence    Living Arrangements Spouse/significant other      Prior Function   Level of Independence Independent      Cognition   Overall Cognitive Status Within Functional Limits for tasks assessed      Sensation   Light Touch Appears Intact      Coordination   Gross Motor Movements are Fluid and Coordinated Yes    Fine Motor Movements are Fluid and Coordinated Yes      Posture/Postural Control   Posture/Postural Control Postural limitations    Postural Limitations Rounded Shoulders;Posterior pelvic tilt      ROM / Strength   AROM / PROM / Strength AROM;Strength      AROM   Overall AROM   Deficits    AROM Assessment Site Thoracic;Lumbar    Lumbar Flexion limited by 50%    Lumbar Extension limited by 50%    Lumbar - Right Side Bend limited by 50%    Lumbar - Left Side Bend limited by 50%    Lumbar - Right Rotation WFL    Lumbar - Left Rotation [facility]    Thoracic Flexion limited by 50%    Thoracic Extension limited by 50%    Thoracic - Right Side Bend limited by 50%    Thoracic - Left Side Bend limited by 50%    Thoracic - Right Rotation [facility]    Thoracic - Left Rotation [facility]      Strength   Strength Assessment Site Hip    Right/Left Hip Left;Right    Right Hip Flexion 4/5   pt noted in Rt hip and pelvis with this   Right Hip Extension 4/5    Right Hip External Rotation  4/5    Right Hip Internal Rotation 4/5    Right Hip ABduction 4/5   with reproduced pain in pelvis and hip per pt   Right Hip ADduction 4/5    Left Hip Flexion 4/5    Left Hip Extension 4/5    Left Hip External Rotation 4/5    Left Hip Internal Rotation 4/5    Left Hip ABduction 4/5    Left Hip ADduction 4/5      Flexibility   Soft Tissue Assessment /Muscle Length yes   decreased in bil hamstrings and adductors by 25%     Palpation   Spinal mobility no tenderness reported at spine with palpation    Palpation comment pain with palpation throughout abdomen with increased pain mostly in lower Lt quadrant and Rt lower quadrant, and pain with pubic symphasis.                        Objective measurements completed on examination: See above findings.     Pelvic Floor Special Questions - 05/16/21 0001     Prior Pelvic/Prostate Exam Yes    Date of Last Pelvic/Prostate Exam --   May of 2022- pt reports ruling out GYNO diagnosies contributing to pain   Are you Pregnant or attempting pregnancy? No    Prior  Pregnancies No    Currently Sexually Active Yes    Is this Painful Yes   Painful with pentration and deep, pt reports she feels pain is mostly deep   History of sexually transmitted  disease No    Marinoff Scale pain interrupts completion    Urinary Leakage No    Urinary urgency Yes    Urinary frequency yes    Fecal incontinence No    Fluid intake pt reports she feels 3-4 bottles of water    Caffeine beverages 2cups of coffee in AM, green tea    Falling out feeling (prolapse) No    Pelvic Floor Internal Exam patient identified and patient confirms consent for PT to perform inernal soft tissue work and muscle strength and integrity assessment    Exam Type Vaginal    Sensation WFL    Palpation Noted tenderness with Rt side of pelvis and with initiating exam, tenderness at Bulbocavernosus, ischiocavernosus throughout but more so at medial attachement site and at obturator internus. Pt benefited from extra time throughout pelvic exam to attempt to decrease tension noted with stationary movement at each section of exam with good effect.    Strength fair squeeze, definite lift    Strength # of reps 3    Strength # of seconds 4    Tone increased                      PT Education - 05/16/21 1238     Education Details Pt educated on female anatomy, exam findings, HEP, and lubricants.    Person(s) Educated Patient    Methods Explanation;Demonstration;Tactile cues;Verbal cues;Handout    Comprehension Verbalized understanding;Returned demonstration              PT Short Term Goals - 05/16/21 1248       PT SHORT TERM GOAL #1   Title pt to be I with HEP    Time 6    Period Weeks    Status New    Target Date 06/27/21      PT SHORT TERM GOAL #2   Title pt to report no more than 4/10 pain in Rt hip/pelvis with mobility throughout the day    Time 6    Period Weeks    Status New    Target Date 06/27/21      PT SHORT TERM GOAL #3   Title pt to demonstrate improved ability to contract/relax pelvic floor with breath mechanics at least 50% of the time    Time 6    Period Weeks    Status New    Target Date 06/27/21      PT SHORT TERM GOAL #4   Title  pt to demonstrate at least 4/5 globally at pelvic floor for improved pelvis stability    Time 6    Period Weeks    Target Date 06/27/21               PT Long Term Goals - 05/16/21 1249       PT LONG TERM GOAL #1   Title pt to be I with advanced HEP    Time 4    Period Months    Status New    Target Date 09/16/21      PT LONG TERM GOAL #2   Title pt to demonstrate improved ability to contract/relax pelvic floor with breath mechanics at least 75% of the time    Time 4    Period  Months    Status New    Target Date 09/16/21      PT LONG TERM GOAL #3   Title pt to report no  more than 2/10 pain in Rt hip and pelvis with mobility consistently    Time 4    Period Months    Status New    Target Date 09/16/21      PT LONG TERM GOAL #4   Title pt to demonstrate at least 5/5 globally at pelvic floor for improved pelvic stability    Time 4    Period Months    Status New    Target Date 09/16/21                    Plan - 05/16/21 1239     Clinical Impression Statement Pt is 60yo female referred to clinic for pelvic pain, has h/o small hepatic hemangioma and benign RIGHT renal cyst   2 small fibroids that are stable uterus normal, epicardial fat   extending along the anterior surface of the LEFT diaphragm, no mass and no sign of fissure in anal canal. Pt reports this pain as been present since May 2022 and has consulted multiple MDs about this. Pt found to have decreases spinal mobility, decreased hip flexibility, pain throughout abdomen and anterior pelvis more so in Rt hip compared to Lt. Pt found ot have pain in Rt hip abduction and flexion and Lt hip abduction at Rt hip medially. Pt also found to have weakness in bil hips and internally found to have global pelvic floor weakness, and tenderness throughout Rt side of pelvis with noted increased tension throughout Rt side. Pt reported relief with extra time with each side of pelvis exam and with mild Rt obturator internus  release. Pt reported vaginal dryness at baseline and given lubricant handout and samples.  Pt would benefit from continued PT to address exam findings.    Personal Factors and Comorbidities Time since onset of injury/illness/exacerbation;Fitness    Examination-Activity Limitations Locomotion Level;Transfers;Sit;Stand    Examination-Participation Restrictions Community Activity;Interpersonal Relationship    Stability/Clinical Decision Making Evolving/Moderate complexity    Clinical Decision Making Moderate    Rehab Potential Good    PT Frequency 1x / week    PT Duration 12 weeks    PT Treatment/Interventions ADLs/Self Care Home Management;Taping;Patient/family education;Functional mobility training;Therapeutic activities;Therapeutic exercise;Neuromuscular re-education;Manual techniques;Energy conservation;Passive range of motion;Scar mobilization    PT Next Visit Plan breathing mechanics, hip stretching and strengthening, suction cup at Rt hip and abdomen and back?    PT Home Exercise Plan Access Code: [email or web address]    Consulted and Agree with Plan of Care Patient             Patient will benefit from skilled therapeutic intervention in order to improve the following deficits and impairments:  Pain, Increased fascial restricitons, Impaired tone, Decreased endurance, Decreased activity tolerance, Impaired flexibility, Decreased mobility, Decreased strength, Postural dysfunction, Improper body mechanics  Visit Diagnosis: Unspecified lack of coordination - Plan: PT plan of care cert/re-cert  Muscle weakness (generalized) - Plan: PT plan of care cert/re-cert  Other abnormalities of gait and mobility - Plan: PT plan of care cert/re-cert     Problem List Patient Active Problem List   Diagnosis Date Noted   Hx of colonic polyps 08/11/2016   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 [place], [place], [address] Phone: [phone]   Fax:  [phone]  Name: Mccoy Chao MRN: [id number] Date of Birth: [birth year]

## 2021-05-20 ENCOUNTER — Ambulatory Visit: Payer: BC Managed Care – PPO | Attending: Internal Medicine | Admitting: Physical Therapy

## 2021-05-20 DIAGNOSIS — M6281 Muscle weakness (generalized): Secondary | ICD-10-CM | POA: Insufficient documentation

## 2021-05-20 DIAGNOSIS — R252 Cramp and spasm: Secondary | ICD-10-CM | POA: Insufficient documentation

## 2021-05-20 DIAGNOSIS — R2689 Other abnormalities of gait and mobility: Secondary | ICD-10-CM | POA: Insufficient documentation

## 2021-05-24 ENCOUNTER — Other Ambulatory Visit: Payer: Self-pay | Admitting: Internal Medicine

## 2021-05-25 LAB — LIPOPROTEIN ANALYSIS BY NMR
HDL Particle Number: 41.3 umol/L (ref >=30.5)
LDL Particle Number: 1715 nmol/L — ABNORMAL HIGH (ref <1000)
LDL Size: 21.5 nm (ref >20.5)
LP-IR Score: 27 (ref <=45)
Small LDL Particle Number: 498 nmol/L (ref <=527)

## 2021-05-25 LAB — INSULIN, RANDOM: INSULIN: 12.6 u[IU]/mL (ref 2.6–24.9)

## 2021-05-30 ENCOUNTER — Other Ambulatory Visit: Payer: Self-pay

## 2021-05-30 ENCOUNTER — Ambulatory Visit: Payer: BC Managed Care – PPO | Admitting: Physical Therapy

## 2021-05-30 DIAGNOSIS — R2689 Other abnormalities of gait and mobility: Secondary | ICD-10-CM

## 2021-05-30 DIAGNOSIS — M6281 Muscle weakness (generalized): Secondary | ICD-10-CM | POA: Diagnosis present

## 2021-05-30 DIAGNOSIS — R252 Cramp and spasm: Secondary | ICD-10-CM | POA: Diagnosis present

## 2021-05-30 NOTE — Therapy (Signed)
[facility] [address] [place], [place], [address] Phone: [phone]   Fax:  [phone]  Physical Therapy Treatment  Patient Details  Name: Kristina Greene MRN: [id number] Date of Birth: Aug 03, 1961 Referring Provider (PT): Gatha Mayer, MD   Encounter Date: 05/30/2021   PT End of Session - 05/30/21 1227     Visit Number 2    Date for PT Re-Evaluation 09/16/21    Authorization Type BCBS    PT Start Time 1146    PT Stop Time 1230    PT Time Calculation (min) 44 min    Activity Tolerance Patient tolerated treatment well;No increased pain    Behavior During Therapy WFL for tasks assessed/performed             Past Medical History:  Diagnosis Date   Allergy    Gastritis    mild chronic   GERD (gastroesophageal reflux disease)    occasionally   Hepatic steatosis    without steatohepatitis   History of thyroid cancer    Hx of colonic polyps 08/11/2016   12 mm ssp 2017   Hyperlipidemia    Hypothyroidism    Multiple allergies    Pancreatitis    distant history   Pituitary macroadenoma ([place])    Stroke ([place])    SILENT TIA  STROKE BETWEEN 09-2018- 02-2020 PER MRI PER PT    Thyroid cancer ([place])    Thyroid disease     Past Surgical History:  Procedure Laterality Date   APPENDECTOMY  1986   [place] bunionectomy Left 06-21-2013   BREAST BIOPSY  09/27/2012   Procedure: BREAST BIOPSY WITH NEEDLE LOCALIZATION;  Surgeon: Haywood Lasso, MD;  Location: [place];  Service: General;  Laterality: Right;   BREAST BIOPSY Right 05/08/2011   BREAST EXCISIONAL BIOPSY Right 09/27/2012   BUNIONECTOMY Right 09-13-2013   COLONOSCOPY     ESOPHAGOGASTRODUODENOSCOPY     HAMMER TOE SURGERY Left    2nd, 3rd, 4th, 5th   HAMMER TOE SURGERY Right 09-13-2013   METATARSAL OSTEOTOMY Left 06-21-2013   5th   POLYPECTOMY     THYROIDECTOMY  2009   TRANSPHENOIDAL PITUITARY RESECTION  02/2017   UPPER  GASTROINTESTINAL ENDOSCOPY      There were no vitals filed for this visit.   Subjective Assessment - 05/30/21 1149     Subjective Pt reports pain has been a little better, no more than 6/10 Rt anterior pelvic area.    Currently in Pain? Yes    Pain Score 5     Pain Location Pelvis    Pain Orientation Right;Medial    Pain Descriptors / Indicators Discomfort    Pain Type Chronic pain                               [facility] Adult PT Treatment/Exercise - 05/30/21 0001       Exercises   Exercises Lumbar;Knee/Hip      Lumbar Exercises: Stretches   Other Lumbar Stretch Exercise childs pose x30s      Lumbar Exercises: Standing   Functional Squats 20 reps    Functional Squats Limitations 2x10, 10# kettlebell      Lumbar Exercises: Supine   Ab Set 10 reps    Clam 20 reps    Clam Limitations blue band 2x10    Bridge 20 reps    Bridge Limitations 2x10    Other Supine Lumbar  Exercises ball squeezes in hooklying 2x10      Lumbar Exercises: Quadruped   [place]/Old Horse 10 reps    Opposite Arm/Leg Raise Right arm/Left leg;Left arm/Right leg;10 reps      Knee/Hip Exercises: Aerobic   Nustep 5 mins L1      Knee/Hip Exercises: Seated   Other Seated Knee/Hip Exercises kneeling squat 10# kettle bell 2x10      Manual Therapy   Manual Therapy Soft tissue mobilization    Manual therapy comments in supine at lower Rt quadrant of abdomen for decreased tension and pt reporting great relief with this                    PT Education - 05/30/21 1225     Education Details Pt educated on technique will exercises and HEP.    Person(s) Educated Patient    Methods Explanation;Demonstration;Tactile cues;Verbal cues    Comprehension Verbalized understanding;Returned demonstration              PT Short Term Goals - 05/16/21 1248       PT SHORT TERM GOAL #1   Title pt to be I with HEP    Time 6    Period Weeks    Status New    Target Date 06/27/21       PT SHORT TERM GOAL #2   Title pt to report no more than 4/10 pain in Rt hip/pelvis with mobility throughout the day    Time 6    Period Weeks    Status New    Target Date 06/27/21      PT SHORT TERM GOAL #3   Title pt to demonstrate improved ability to contract/relax pelvic floor with breath mechanics at least 50% of the time    Time 6    Period Weeks    Status New    Target Date 06/27/21      PT SHORT TERM GOAL #4   Title pt to demonstrate at least 4/5 globally at pelvic floor for improved pelvis stability    Time 6    Period Weeks    Target Date 06/27/21               PT Long Term Goals - 05/16/21 1249       PT LONG TERM GOAL #1   Title pt to be I with advanced HEP    Time 4    Period Months    Status New    Target Date 09/16/21      PT LONG TERM GOAL #2   Title pt to demonstrate improved ability to contract/relax pelvic floor with breath mechanics at least 75% of the time    Time 4    Period Months    Status New    Target Date 09/16/21      PT LONG TERM GOAL #3   Title pt to report no  more than 2/10 pain in Rt hip and pelvis with mobility consistently    Time 4    Period Months    Status New    Target Date 09/16/21      PT LONG TERM GOAL #4   Title pt to demonstrate at least 5/5 globally at pelvic floor for improved pelvic stability    Time 4    Period Months    Status New    Target Date 09/16/21  Plan - 05/30/21 1231     Clinical Impression Statement Pt presents to clinic reporting some improvement in pain since eval but still notes it is there intermittently. Pt session focused on hip and core exercises for improved stability and strength and promote improved technique with breathing mechanics and core activation with all exericses. Pt also benefited from manual work at [place] lower abdomen soft tissue massage and reported this helped with pain to help relax the tissues. Pt would benefit from continued PT to address exam  findings.    Personal Factors and Comorbidities Time since onset of injury/illness/exacerbation;Fitness    Examination-Activity Limitations Locomotion Level;Transfers;Sit;Stand    Examination-Participation Restrictions Community Activity;Interpersonal Relationship    Stability/Clinical Decision Making Evolving/Moderate complexity    Clinical Decision Making Moderate    Rehab Potential Good    PT Frequency 1x / week    PT Duration 12 weeks    PT Treatment/Interventions ADLs/Self Care Home Management;Taping;Patient/family education;Functional mobility training;Therapeutic activities;Therapeutic exercise;Neuromuscular re-education;Manual techniques;Energy conservation;Passive range of motion;Scar mobilization;Aquatic Therapy;Dry needling    PT Next Visit Plan breathing mechanics, hip stretching and strengthening, suction cup at Rt hip and abdomen and back?    PT Home Exercise Plan Access Code: [email or web address]    Consulted and Agree with Plan of Care Patient             Patient will benefit from skilled therapeutic intervention in order to improve the following deficits and impairments:  Pain, Increased fascial restricitons, Impaired tone, Decreased endurance, Decreased activity tolerance, Impaired flexibility, Decreased mobility, Decreased strength, Postural dysfunction, Improper body mechanics  Visit Diagnosis: Muscle weakness (generalized)  Other abnormalities of gait and mobility  Cramp and spasm     Problem List Patient Active Problem List   Diagnosis Date Noted   Hx of colonic polyps 08/11/2016   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, [place], [place], [address] Phone: [phone]   Fax:  [phone]  Name: Kristina Greene MRN:  [id number] Date of Birth: 01-20-1961

## 2021-06-04 ENCOUNTER — Encounter: Payer: BC Managed Care – PPO | Admitting: Physical Therapy

## 2021-06-13 ENCOUNTER — Encounter: Payer: BC Managed Care – PPO | Admitting: Physical Therapy

## 2021-08-16 ENCOUNTER — Encounter: Payer: Self-pay | Admitting: Internal Medicine

## 2021-08-16 ENCOUNTER — Ambulatory Visit (INDEPENDENT_AMBULATORY_CARE_PROVIDER_SITE_OTHER): Payer: BC Managed Care – PPO | Admitting: Internal Medicine

## 2021-08-16 VITALS — BP 102/68 | HR 65 | Ht 64.0 in | Wt 170.2 lb

## 2021-08-16 DIAGNOSIS — K6289 Other specified diseases of anus and rectum: Secondary | ICD-10-CM

## 2021-08-16 DIAGNOSIS — R109 Unspecified abdominal pain: Secondary | ICD-10-CM | POA: Diagnosis not present

## 2021-08-16 DIAGNOSIS — K760 Fatty (change of) liver, not elsewhere classified: Secondary | ICD-10-CM | POA: Diagnosis not present

## 2021-08-16 NOTE — Patient Instructions (Addendum)
Video recommendations - You Tube  Nutritional Model of Modern disease Dr. Darrol Jump  Fat Fiction is the other movie and is also on You Tube  Keep up the good work cutting back on sugar. Next step is to cut back on fruit consumption.   I appreciate the opportunity to care for you. Silvano Rusk, MD, [facility]

## 2021-08-16 NOTE — Progress Notes (Signed)
Kristina Greene 60 y.o. 1961/05/02 [id number]  Assessment & Plan:   Encounter Diagnoses  Name Primary?   NAFLD (nonalcoholic fatty liver disease) Yes   Rectal pain    Abdominal wall pain     Kristina Greene is improved overall.  She will continue with her PT and home exercise plan.  I think having lipids maybe every 6 months is reasonable she has had a lot of test recently and she will continue to work on dietary changes to reduce carbohydrate consumption.  We once again reviewed the contribution of fructose to nonalcoholic fatty liver disease now important it is to reduce her fruit if she is over consuming that and other carbohydrates.  She does not drink soda or other liquids with a lot of high fructose corn syrup at this point.  That is good and she will continue.  She asked about changes in Crestor, her neurologist is recommended she go from 10 to 20 mg I do not think that is wrong but I defer to primary care or other providers.  I reassured her as best I could that there were no serious health problems ongoing with respect to the changes in imaging i.e. this epicardial fat pad that is stable hemangioma of the liver, and that she has a fibrosis 4 score that suggest fibrosis scale of 2-3.  There is no significant liver dysfunction at this time but she clearly has metabolic derangements which she is working on is on medical therapy for as well.  I think if she continues that it manages to modify her diet there is a good chance she could improve her metabolic profile and reduce or eliminate fatty liver disease and hopefully reduce future cardiovascular events and diabetes risk.  I recommended she return to me in 1 year sooner as needed. CC: Michell Heinrich, DO    Subjective:   Chief Complaint: Abdominal pain, rectal pain, follow-up of labs, fatty liver  HPI Kristina Greene is here with her husband.  She reports that overall she is improved.  Pelvic floor physical therapy is providing improvement in her  rectal symptoms and defecation issues.  There is also improvement with her abdominal wall pain.  She has had additional lab testing.  She wants to go over that and she brings that with her today.  She has a diagnosis of nonalcoholic fatty liver disease.  We had previously talked about diet manipulation and now reducing carbohydrates specifically sugars can help that.  I had her do some more sophisticated lipid and insulin resistant testing which she did.  In August she had studies and her total LDL particle number is on the high side at 1715 with an HDL of 41 and an LDL size of 21.  Small LDL particle number was 498 which is low.  Insulin resistance marker score was 27 which is at the 25th percentile.  Her fasting insulin level was 12.6.  She has had some mildly abnormal transaminases.  In July alk phos 56 AST 44 slightly elevated above 40 and ALT 47.  During those times her HDL was 69 and her total cholesterol was 189 and her triglycerides 86.  She has been working on her diet and those numbers are improved her LFTs are now normal in October CBC is normal with normal platelets triglycerides went down to 76.  AST 35 ALT 37.  She is decreasing the amount of cheese she eats reports that she eats "a lot fruit" she does not deny this, she is overall trying  to reduce carbs she is drinking minimal water with apple cider vinegar in it.  She is continue to follow-up at [facility] and there is suspicion of a patent foramen ovale which they think might related to her previous stroke.  She has seen a cardiologist recently as well though I do not think there were any interventions.  She continues to raise questions about this epicardial fat pad that was seen when she was in [place] state and we reviewed again that it was unchanged in size and we reviewed that she has a very small hemangioma of the liver and a cyst in the kidney that are unchanged in size and they do not need follow-up. Wt Readings from Last 3  Encounters:  08/16/21 170 lb 4 oz (77.2 kg)  05/07/21 171 lb 3.2 oz (77.7 kg)  03/01/21 174 lb (78.9 kg)     Allergies  Allergen Reactions   Molds & Smuts Itching, Swelling and Other (See Comments)    Other reaction(s): Cough (ALLERGY/intolerance)-- pT STATES MOLDS  Rhinorrhea and itchy eyes Rhinorrhea and itchy eyes    Pollen Extract Other (See Comments)    Rhinorrhea and itchy eyes Rhinorrhea and itchy eyes Rhinorrhea and itchy eyes Rhinorrhea and itchy eyes   Current Meds  Medication Sig   aspirin EC 81 MG tablet Take 81 mg by mouth daily. Swallow whole.   Cholecalciferol (VITAMIN D-3 PO) Take 1 capsule by mouth daily. 5000 IU   Coenzyme Q10 (COQ10) 100 MG CAPS Take 1 capsule by mouth daily.   levothyroxine (SYNTHROID) 100 MCG tablet Take 100 mcg by mouth daily before breakfast.   Multiple Vitamin (MULTIVITAMIN) capsule Take 1 capsule by mouth daily.   rosuvastatin (CRESTOR) 10 MG tablet Take 10 mg by mouth daily.   vitamin B-12 (CYANOCOBALAMIN) 1000 MCG tablet Take 1,000 mcg by mouth daily.   Past Medical History:  Diagnosis Date   Allergy    Gastritis    mild chronic   GERD (gastroesophageal reflux disease)    occasionally   Hepatic steatosis    without steatohepatitis   History of thyroid cancer    Hx of colonic polyps 08/11/2016   12 mm ssp 2017   Hyperlipidemia    Hypothyroidism    Multiple allergies    Pancreatitis    distant history   Pituitary macroadenoma ([place])    Stroke ([place])    SILENT TIA  STROKE BETWEEN 09-2018- 02-2020 PER MRI PER PT    Past Surgical History:  Procedure Laterality Date   APPENDECTOMY  1986   [place] bunionectomy Left 06-21-2013   BREAST BIOPSY  09/27/2012   Procedure: BREAST BIOPSY WITH NEEDLE LOCALIZATION;  Surgeon: Haywood Lasso, MD;  Location: [facility];  Service: General;  Laterality: Right;   BREAST BIOPSY Right 05/08/2011   BREAST EXCISIONAL BIOPSY Right 09/27/2012   BUNIONECTOMY Right 09-13-2013    COLONOSCOPY     ESOPHAGOGASTRODUODENOSCOPY     HAMMER TOE SURGERY Left    2nd, 3rd, 4th, 5th   HAMMER TOE SURGERY Right 09-13-2013   METATARSAL OSTEOTOMY Left 06-21-2013   5th   POLYPECTOMY     THYROIDECTOMY  2009   TRANSPHENOIDAL PITUITARY RESECTION  02/2017   UPPER GASTROINTESTINAL ENDOSCOPY     Social History   Social History Narrative   Originally from [place] in [place]   Married   + children   Has been employed at [employer]   No alcohol never smoker 3-4 caffeinated beverages daily   family  history includes Colon cancer in her paternal uncle; Liver disease in her brother.   Review of Systems As above  Objective:   Physical Exam BP 102/68   Pulse 65   Ht 5' 4"  (1.626 m)   Wt 170 lb 4 oz (77.2 kg)   LMP 01/04/2013 Comment: menopausal  BMI 29.22 kg/m   45 minutes total time spent before during and after patient contact on this visit

## 2022-01-16 ENCOUNTER — Encounter: Payer: Self-pay | Admitting: Internal Medicine

## 2022-01-16 ENCOUNTER — Ambulatory Visit (INDEPENDENT_AMBULATORY_CARE_PROVIDER_SITE_OTHER): Payer: BC Managed Care – PPO | Admitting: Internal Medicine

## 2022-01-16 VITALS — BP 100/60 | HR 64 | Ht 64.0 in | Wt 178.0 lb

## 2022-01-16 DIAGNOSIS — K219 Gastro-esophageal reflux disease without esophagitis: Secondary | ICD-10-CM | POA: Diagnosis not present

## 2022-01-16 DIAGNOSIS — K760 Fatty (change of) liver, not elsewhere classified: Secondary | ICD-10-CM

## 2022-01-16 DIAGNOSIS — K6289 Other specified diseases of anus and rectum: Secondary | ICD-10-CM

## 2022-01-16 DIAGNOSIS — R3129 Other microscopic hematuria: Secondary | ICD-10-CM

## 2022-01-16 DIAGNOSIS — R102 Pelvic and perineal pain: Secondary | ICD-10-CM

## 2022-01-16 DIAGNOSIS — R300 Dysuria: Secondary | ICD-10-CM

## 2022-01-16 DIAGNOSIS — R109 Unspecified abdominal pain: Secondary | ICD-10-CM

## 2022-01-16 NOTE — Progress Notes (Addendum)
? ?Veora Fonte 60 y.o. 03-01-1961 [id number] ? ?Assessment & Plan:  ? ?Encounter Diagnoses  ?Name Primary?  ? NAFLD (nonalcoholic fatty liver disease) Yes  ? Gastroesophageal reflux disease, unspecified whether esophagitis present   ? Abdominal wall pain   ? Rectal pain   ? Pelvic pain   ? Microhematuria   ? Dysuria   ? ? ?Regarding her fatty liver disease she remains concerned about this.  Metabolically I think her numbers are overall good.  Her weight is a bit high, BMI is up at 30, but we all know that is an imperfect measure.  That said I understand why her endocrinologist raise the question of using GLP-1 antagonist.  I do not know that those have been proven to fix fatty liver I do not think so, but the associated weight loss certainly could be beneficial.  She has reduced carbohydrates some I suspect she could do more of that.  Right now, I think it would be a mistake to introduce a GLP-1 antagonist because of the potential side effects causing confusion amidst all of her abdominal and pelvic complaints.  We can save that for possible use later. ? ?I will have her do a complete abdominal ultrasound and hepatic elastography for assessment of her nonalcoholic fatty liver disease. ? ?She will use as needed antacids like Tums or Mylanta or Maalox and possibly take an extra 20 mg omeprazole at times regarding GERD.  Watch diet for triggers as well. ? ?[facility] regarding her pelvic pain microhematuria and dysuria.  Question if she could have interstitial cystitis. ? ?Abdominal wall pain continues, I do not have any specific recommendations for that at this time.  I do not think there is a medication for it necessarily perhaps physical therapy.  Await the results of the above work-up. ? ?CC: Michell Heinrich, DO ?Dr. Bland Span [facility] ? ?Subjective:  ? ?Chief Complaint: multiple ? ?HPI ?Sumi presents with her husband with several issues.  She is a 87 year old white woman  originally from [place], with a past medical history of pituitary macroadenoma status postresection, hypothyroidism, prior thyroid cancer, nonalcoholic fatty liver disease, GERD, stroke. ? ?Issue #1 is abdominal pain mostly to palpation but when sitting as well.  After about 5 minutes in front of the computer she will have upper abdominal discomfort.  When she palpates her upper abdomen there are tender areas very sensitive in the right upper quadrant epigastrium and left upper quadrant.  These are the same areas that bother her when sitting and sometimes it radiates to her back.  She does have some chronic low back pain and degenerative disc disease and has seen a chiropractor. ? ? ?Issue #2 is urinary symptoms she continues to have microhematuria issues and still has pelvic pain also.  She has had pain in her inner thighs as well.  She reports having had an ultrasound of that area without any abnormalities.  She has seen her gynecologist in [place] and has persistent microhematuria a pelvic and abdominal ultrasound were okay.  Her gynecologist had referred her to the chiropractor for "twisted pelvis".  She does not have urinary incontinence. ? ?Issue #3 she still has persistent intermittent rectal pressure.  Colonoscopy has been unrevealing.  Bowel habits are normal overall with occasional soft stool. ? ?Issue #4 is nonalcoholic fatty liver disease.  Her endocrinologist has raised the question of possibly using GLP-1 antagonist to help her lose weight and to treat her fatty liver.  She has  had fatty liver, she had a fasting insulin level of 12 last year.  She has labs now that show a triglyceride level of 76 in October, LDL 110 VLDL 12 A1c 5.3% total cholesterol 189 with an HDL of 66.  Fasting glucose 83.  I had her do a particle analysis by NMR last year and total particles were high at 1715, small LDL particle number however was only 498 and her size was 21.5.  Her calculated LP-IR score was 27 which puts her  in the insulin sensitive range.  Transaminases AST 36 ALT 37.  She has changed her diet she is eating less bread and she is taking 2 tablespoons of apple cider vinegar and mineral water each day. ? ?Issue #5 is acid reflux-sometimes she has heartburn symptoms later in the day and takes an extra 20 mg omeprazole.  She has not tried any antacids. ? ? ?Wt Readings from Last 3 Encounters:  ?01/16/22 178 lb (80.7 kg)  ?08/16/21 170 lb 4 oz (77.2 kg)  ?05/07/21 171 lb 3.2 oz (77.7 kg)  ? ? ?Allergies  ?Allergen Reactions  ? Molds & Smuts Itching, Swelling and Other (See Comments)  ?  Other reaction(s): Cough (ALLERGY/intolerance)-- pT STATES MOLDS  ?Rhinorrhea and itchy eyes ?Rhinorrhea and itchy eyes ?  ? Pollen Extract Other (See Comments)  ?  Rhinorrhea and itchy eyes ?Rhinorrhea and itchy eyes ?Rhinorrhea and itchy eyes ?Rhinorrhea and itchy eyes  ? ?Current Meds  ?Medication Sig  ? aspirin EC 81 MG tablet Take 81 mg by mouth daily. Swallow whole.  ? Cholecalciferol (VITAMIN D-3 PO) Take 1 capsule by mouth daily. 5000 IU  ? Coenzyme Q10 (COQ10) 100 MG CAPS Take 1 capsule by mouth daily.  ? levothyroxine (SYNTHROID) 100 MCG tablet Take 100 mcg by mouth daily before breakfast. Take 6 days a week Mon through Sat  ? Multiple Vitamin (MULTIVITAMIN) capsule Take 1 capsule by mouth daily.  ? rosuvastatin (CRESTOR) 10 MG tablet Take 20 mg by mouth daily.  ? vitamin B-12 (CYANOCOBALAMIN) 1000 MCG tablet Take 1,000 mcg by mouth daily.  ? ?Past Medical History:  ?Diagnosis Date  ? Allergy   ? Gastritis   ? mild chronic  ? GERD (gastroesophageal reflux disease)   ? occasionally  ? Hepatic steatosis   ? without steatohepatitis  ? History of thyroid cancer   ? Hx of colonic polyps 08/11/2016  ? 12 mm ssp 2017  ? Hyperlipidemia   ? Hypothyroidism   ? Multiple allergies   ? Pancreatitis   ? distant history  ? Pituitary macroadenoma ([place])   ? Stroke [facility])   ? 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 ?Past Surgical  History:  ?Procedure Laterality Date  ? APPENDECTOMY  1986  ? [place] bunionectomy Left 06-21-2013  ? BREAST BIOPSY  09/27/2012  ? Procedure: BREAST BIOPSY WITH NEEDLE LOCALIZATION;  Surgeon: Haywood Lasso, MD;  Location: [facility];  Service: General;  Laterality: Right;  ? BREAST BIOPSY Right 05/08/2011  ? BREAST EXCISIONAL BIOPSY Right 09/27/2012  ? BUNIONECTOMY Right 09-13-2013  ? COLONOSCOPY    ? ESOPHAGOGASTRODUODENOSCOPY    ? HAMMER TOE SURGERY Left   ? 2nd, 3rd, 4th, 5th  ? HAMMER TOE SURGERY Right 09-13-2013  ? METATARSAL OSTEOTOMY Left 06-21-2013  ? 5th  ? POLYPECTOMY    ? THYROIDECTOMY  2009  ? TRANSPHENOIDAL PITUITARY RESECTION  02/2017  ? UPPER GASTROINTESTINAL ENDOSCOPY    ? ?  Social History  ? ?Social History Narrative  ? Originally from [place] in [place]  ? Married  ? + children  ? Has been employed at [employer]  ? No alcohol never smoker 3-4 caffeinated beverages daily  ? ?family history includes Colon cancer in her paternal uncle; Liver disease in her brother. ? ? ?Review of Systems ?See HPI ? ?Objective:  ? Physical Exam ?BP 100/60   Pulse 64   Ht '5\' 4"'$  (1.626 m)   Wt 178 lb (80.7 kg)   LMP 01/04/2013 Comment: menopausal  BMI 30.55 kg/m?  ?Overweight middle-aged white woman in no acute distress ?The abdomen is soft and she complains of tenderness in the epigastrium and an area in the left upper quadrant and right upper quadrant.  It seems to be abdominal wall based upon muscle tension and palpation.  I do not detect any subcutaneous lesions.  There are no hernias. ? ?44 minutes total time ? ?

## 2022-01-16 NOTE — Patient Instructions (Signed)
You will be contacted by [facility] in the next 2 days to arrange a complete abdominal ultrasound with elastopathy    The number on your caller ID will be [phone], please answer when they call.  If you have not heard from them in 2 days please call [phone] to schedule.   ? ? ?We will work on a referral to [facility]. Phone # [phone]. ? ?You may take over the counter Tums if you get heartburn later in the day. ? ? ?I appreciate the opportunity to care for you. ?Silvano Rusk, MD, [facility] ? ?

## 2022-01-17 ENCOUNTER — Telehealth: Payer: Self-pay

## 2022-01-17 NOTE — Telephone Encounter (Signed)
After holding an hour with [facility] I spoke to [place] and made Verdis Frederickson an appointment with Dr Rexene Alberts for 01/30/2022 at 3:00pm , arrive at 2:45pm. They are mailing her out information. I have informed her husband Mr Odor about the appointment and he wrote it down. I have faxed them her records. Phone # [phone], fax # [phone]. ?

## 2022-01-22 ENCOUNTER — Telehealth: Payer: Self-pay

## 2022-01-22 NOTE — Telephone Encounter (Signed)
NOTES SCANNED TO REFERRAL 

## 2022-02-03 ENCOUNTER — Ambulatory Visit (HOSPITAL_COMMUNITY)
Admission: RE | Admit: 2022-02-03 | Discharge: 2022-02-03 | Disposition: A | Discharge status: Home / Self Care | Payer: BC Managed Care – PPO | Source: Ambulatory Visit | Attending: Internal Medicine | Admitting: Internal Medicine

## 2022-02-03 DIAGNOSIS — R109 Unspecified abdominal pain: Secondary | ICD-10-CM | POA: Insufficient documentation

## 2022-02-03 DIAGNOSIS — K760 Fatty (change of) liver, not elsewhere classified: Secondary | ICD-10-CM | POA: Diagnosis present

## 2022-02-03 IMAGING — US US ABDOMEN COMPLETE W/ ELASTOGRAPHY
1 series · 12 of 25 positions shown · non-contrast
Comparison: MRI [DATE]

CLINICAL DATA: History of nonalcoholic fatty liver disease.

EXAM:
ULTRASOUND ABDOMEN
ULTRASOUND HEPATIC ELASTOGRAPHY
TECHNIQUE: Sonography of the upper abdomen was performed. In addition,
ultrasound elastography evaluation of the liver was performed. A
region of interest was placed within the right lobe of the liver.
Following application of a compressive sonographic pulse, tissue
compressibility was assessed. Multiple assessments were performed at
the selected site. Median tissue compressibility was determined.
Previously, hepatic stiffness was assessed by shear wave velocity.
Based on recently published Society of Radiologists in Ultrasound
consensus article, reporting is now recommended to be performed in
the SI units of pressure (kiloPascals) representing hepatic
stiffness/elasticity. The obtained result is compared to the
published reference standards. (cACLD = compensated Advanced Chronic
Liver Disease)

[Series 1: us abdomen complete w/ elastography · 12 of 129 slices shown]
[im 6/129]
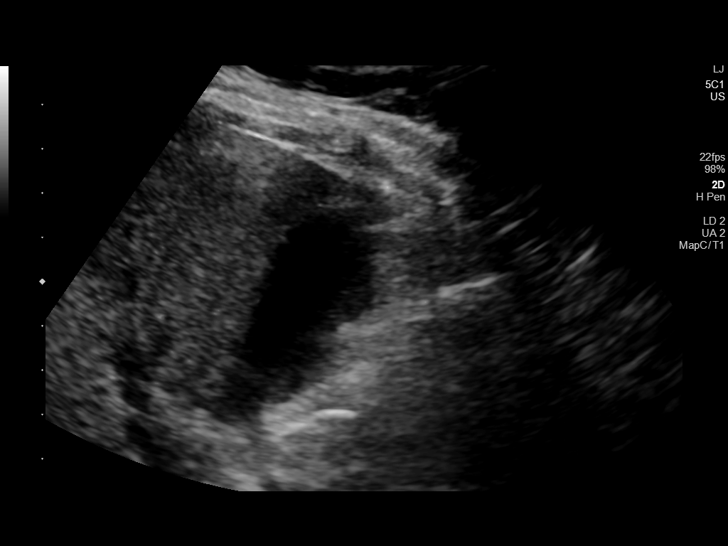
[im 17/129]
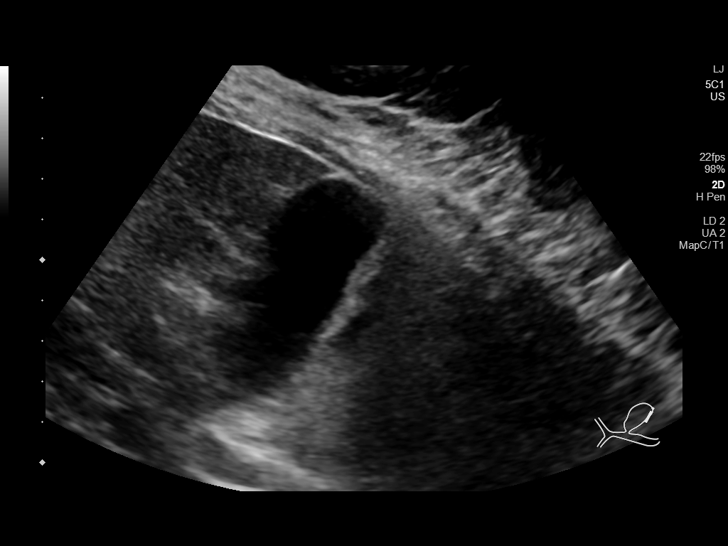
[im 27/129]
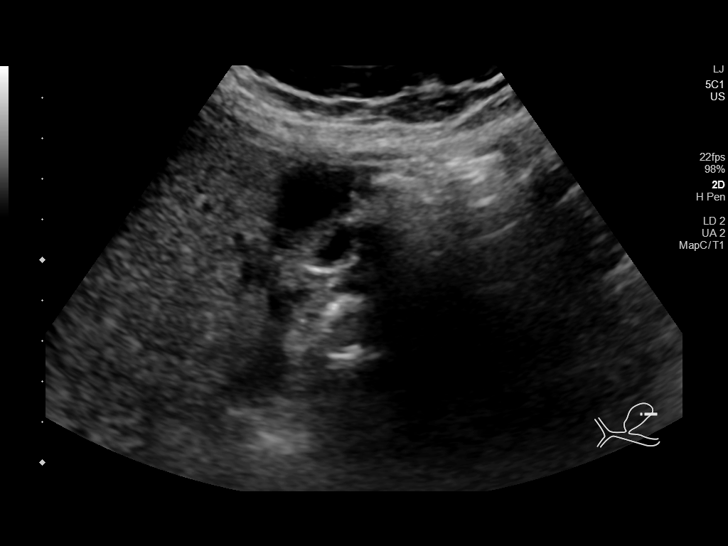
[im 38/129]
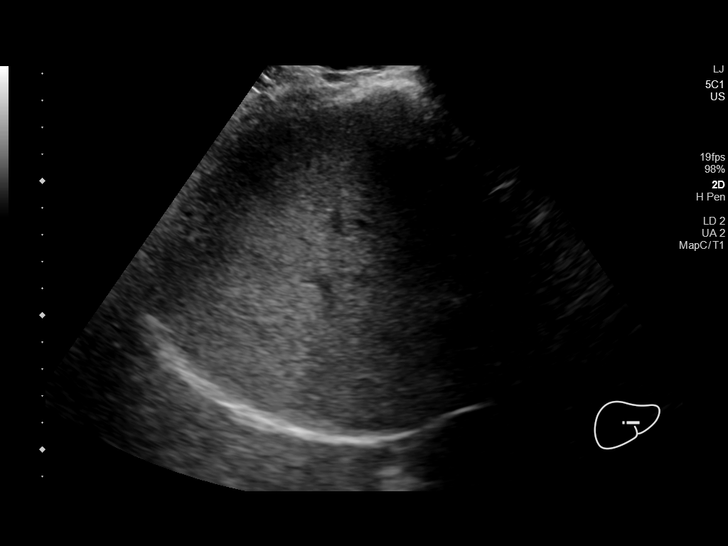
[im 49/129]
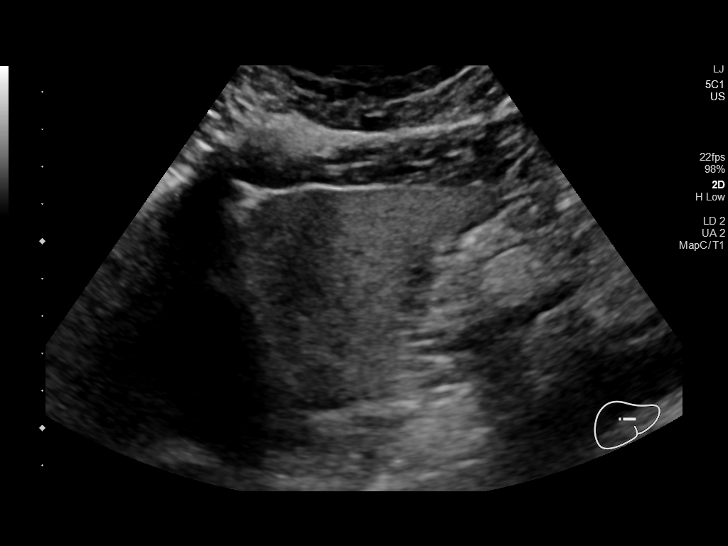
[im 59/129]
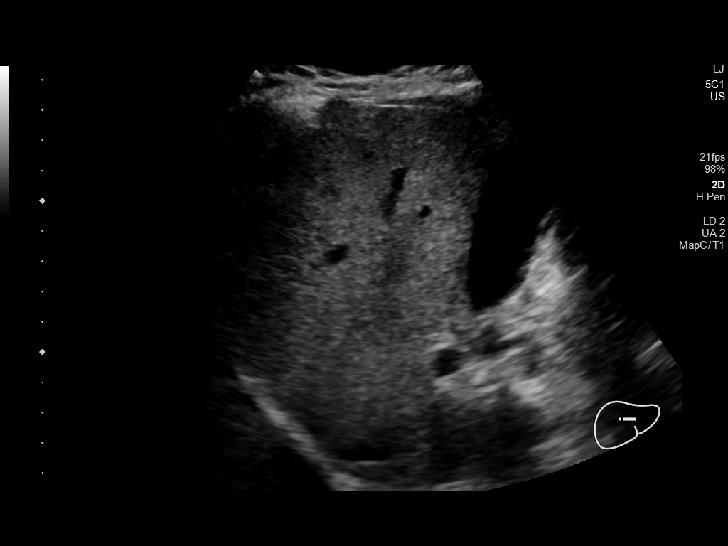
[im 70/129]
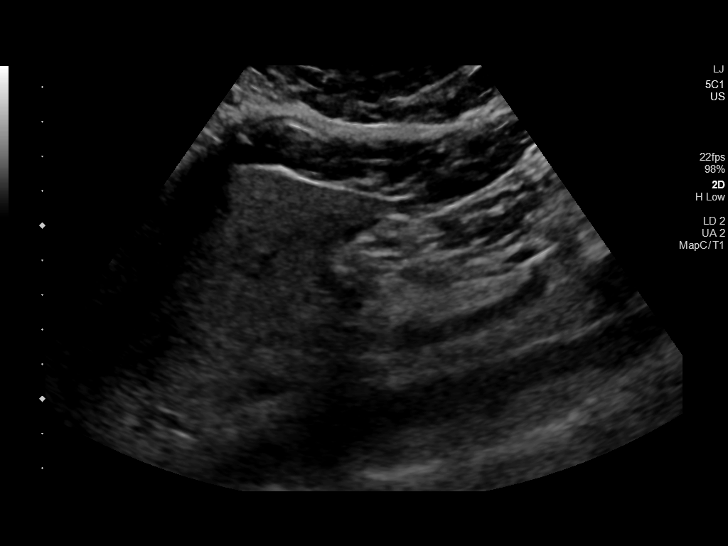
[im 81/129]
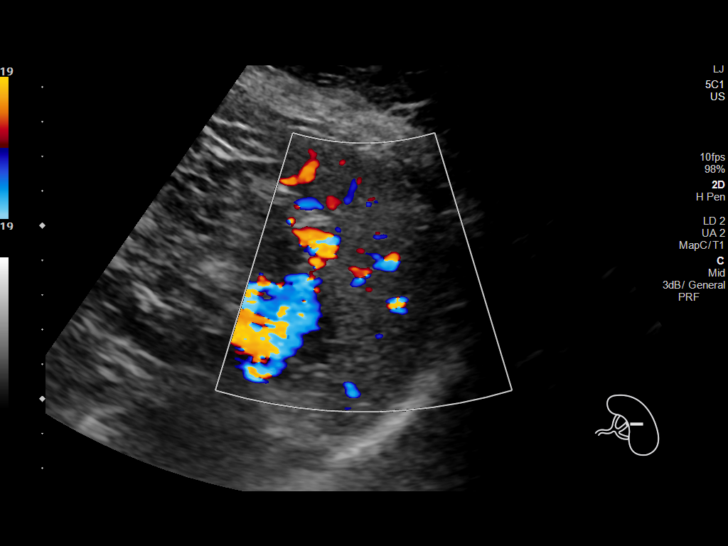
[im 91/129]
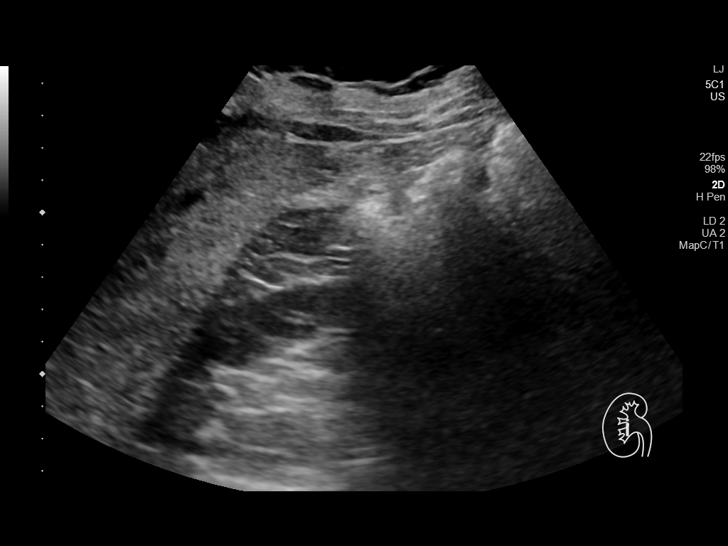
[im 102/129]
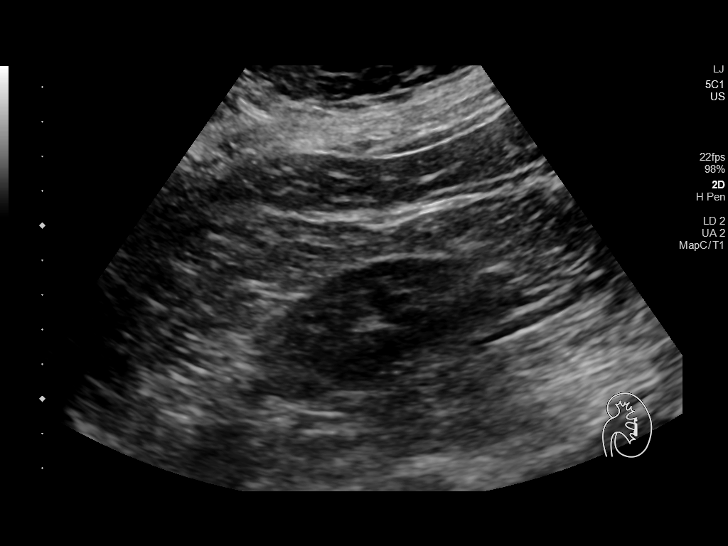
[im 113/129]
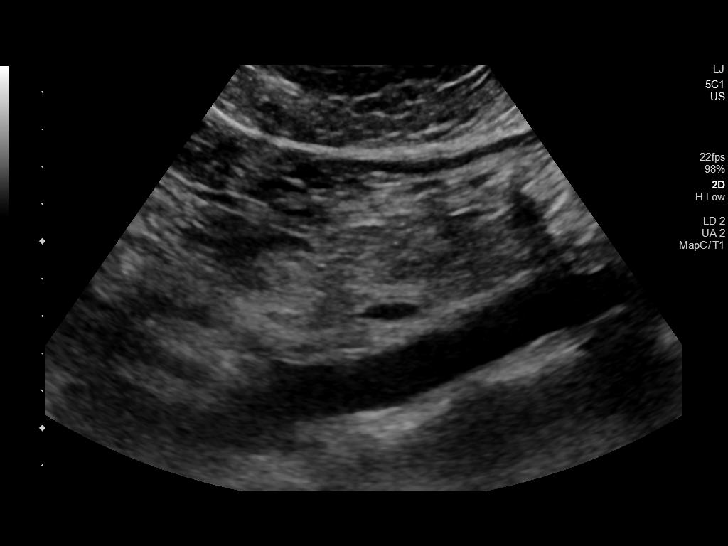
[im 123/129]
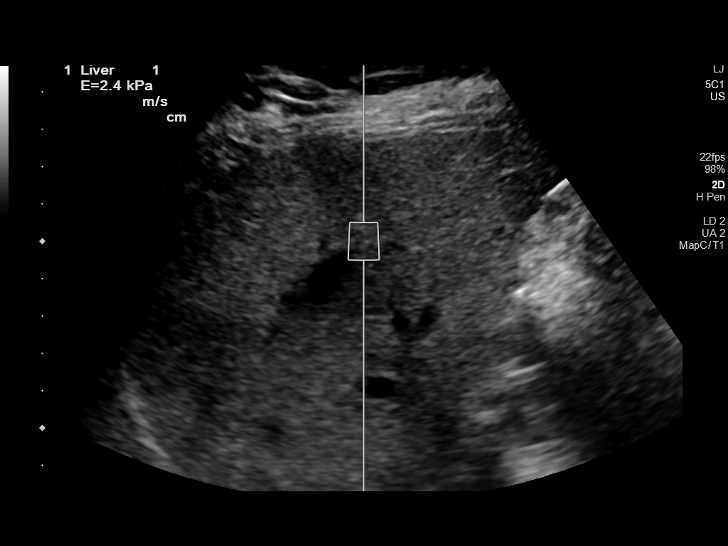

[12 of 25 positions shown; findings below may reference images not displayed]

FINDINGS: ULTRASOUND ABDOMEN

Gallbladder: Biliary sludge/tiny nonshadowing stones without
gallbladder wall thickening or pericholecystic fluid. No sonographic
Murphy sign noted by sonographer.

Common bile duct: Diameter: 4 mm

Liver: Wedge-shaped area of relative hypoechogenicity in the right
lobe of the liver near the gallbladder fossa measuring 4.8 x 4.4 x
5.2 cm. Diffusely increased parenchymal echogenicity. Portal vein is
patent on color Doppler imaging with normal direction of blood flow
towards the liver.

IVC: No abnormality visualized.

Pancreas: Visualized portion unremarkable.

Spleen: Size and appearance within normal limits.

Right Kidney: Length: 12.4 cm. Echogenicity within normal limits. No
hydronephrosis. 2.4 cm renal cyst.

Left Kidney: Length: 11.3 cm. Echogenicity within normal limits. No
mass or hydronephrosis visualized.

Abdominal aorta: No aneurysm visualized.

Other findings: None.

ULTRASOUND HEPATIC ELASTOGRAPHY

Device: Siemens Helix VTQ

Patient position: Supine

Transducer 5C1

Number of measurements: 10

Hepatic segment:  8

Median kPa:

IQR:

IQR/Median kPa ratio:

Data quality:  Good

Diagnostic category:  < or = 5 kPa: high probability of being normal

The use of hepatic elastography is applicable to patients with viral
hepatitis and non-alcoholic fatty liver disease. At this time, there
is insufficient data for the referenced cut-off values and use in
other causes of liver disease, including alcoholic liver disease.
Patients, however, may be assessed by elastography and serve as
their own reference standard/baseline.

In patients with non-alcoholic liver disease, the values suggesting
compensated advanced chronic liver disease (cACLD) may be lower, and
patients may need additional testing with elasticity results of [DATE]
kPa.

Please note that abnormal hepatic elasticity and shear wave
velocities may also be identified in clinical settings other than
with hepatic fibrosis, such as: acute hepatitis, elevated right
heart and central venous pressures including use of beta blockers,
NOSE disease (NOSE), infiltrative processes such as
mastocytosis/amyloidosis/infiltrative tumor/lymphoma, extrahepatic
cholestasis, with hyperemia in the post-prandial state, and with
liver transplantation. Correlation with patient history, laboratory
data, and clinical condition recommended.

Diagnostic Categories:

< or =5 kPa: high probability of being normal

< or =9 kPa: in the absence of other known clinical signs, rules [DATE] kPa and ?13 kPa: suggestive of cACLD, but needs further testing

>13 kPa: highly suggestive of cACLD

> or =17 kPa: highly suggestive of cACLD with an increased
probability of clinically significant portal hypertension
IMPRESSION: ULTRASOUND ABDOMEN:

Diffuse hepatic steatosis. Wedge-shaped hypoechoic area in the right
lobe of the liver near the gallbladder fossa measuring 5.2 cm most
likely reflects focal fatty sparing. If clinically indicated this
could be more definitively characterized by hepatic protocol MRI
with and without contrast.

ULTRASOUND HEPATIC ELASTOGRAPHY:

Median kPa:

Diagnostic category:  < or = 5 kPa: high probability of being normal

## 2022-02-05 ENCOUNTER — Other Ambulatory Visit: Payer: Self-pay

## 2022-02-05 DIAGNOSIS — R932 Abnormal findings on diagnostic imaging of liver and biliary tract: Secondary | ICD-10-CM

## 2022-02-05 DIAGNOSIS — K760 Fatty (change of) liver, not elsewhere classified: Secondary | ICD-10-CM

## 2022-02-05 DIAGNOSIS — K769 Liver disease, unspecified: Secondary | ICD-10-CM

## 2022-02-11 NOTE — Progress Notes (Deleted)
?Cardiology Office Note:   ? ?Date:  02/11/2022  ? ?ID:  Mattisen Pohlmann, DOB [birth year], MRN [id number] ? ?PCP:  Sherrilee Gilles, DO ?  ?[facility] Providers ?Cardiologist:  None {  ? ?Referring MD: Sherrilee Gilles, DO  ? ? ?History of Present Illness:   ? ?Kristina Greene is a 61 y.o. female with a hx of NAFLD and GERD who was referred by Dr. Sallee Provencal for further evaluation of palpitations. ? ?Today, *** ? ?Past Medical History:  ?Diagnosis Date  ? Allergy   ? Gastritis   ? mild chronic  ? GERD (gastroesophageal reflux disease)   ? occasionally  ? Hepatic steatosis   ? without steatohepatitis  ? History of thyroid cancer   ? Hx of colonic polyps 08/11/2016  ? 12 mm ssp 2017  ? Hyperlipidemia   ? Hypothyroidism   ? Multiple allergies   ? Pancreatitis   ? distant history  ? Pituitary macroadenoma ([place])   ? Stroke [facility])   ? 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 ? ?Past Surgical History:  ?Procedure Laterality Date  ? APPENDECTOMY  1986  ? austin bunionectomy Left 06-21-2013  ? BREAST BIOPSY  09/27/2012  ? Procedure: BREAST BIOPSY WITH NEEDLE LOCALIZATION;  Surgeon: Haywood Lasso, MD;  Location: [place];  Service: General;  Laterality: Right;  ? BREAST BIOPSY Right 05/08/2011  ? BREAST EXCISIONAL BIOPSY Right 09/27/2012  ? BUNIONECTOMY Right 09-13-2013  ? COLONOSCOPY    ? ESOPHAGOGASTRODUODENOSCOPY    ? HAMMER TOE SURGERY Left   ? 2nd, 3rd, 4th, 5th  ? HAMMER TOE SURGERY Right 09-13-2013  ? METATARSAL OSTEOTOMY Left 06-21-2013  ? 5th  ? POLYPECTOMY    ? THYROIDECTOMY  2009  ? TRANSPHENOIDAL PITUITARY RESECTION  02/2017  ? UPPER GASTROINTESTINAL ENDOSCOPY    ? ? ?Current Medications: ?No outpatient medications have been marked as taking for the 02/17/22 encounter (Appointment) with Freada Bergeron, MD.  ?  ? ?Allergies:   Molds & smuts and Pollen extract  ? ?Social History  ? ?Socioeconomic History  ? Marital status: Married  ?  Spouse name: Not on file  ? Number of children: Not on  file  ? Years of education: Not on file  ? Highest education level: Not on file  ?Occupational History  ?  Employer: [employer]  ?Tobacco Use  ? Smoking status: Never  ? Smokeless tobacco: Never  ?Vaping Use  ? Vaping Use: Never used  ?Substance and Sexual Activity  ? Alcohol use: No  ? Drug use: No  ? Sexual activity: Not on file  ?Other Topics Concern  ? Not on file  ?Social History Narrative  ? Originally from [place] in [place]  ? Married  ? + children  ? Has been employed at [employer]  ? No alcohol never smoker 3-4 caffeinated beverages daily  ? ?Social Determinants of Health  ? ?Financial Resource Strain: Not on file  ?Food Insecurity: Not on file  ?Transportation Needs: Not on file  ?Physical Activity: Not on file  ?Stress: Not on file  ?Social Connections: Not on file  ?  ? ?Family History: ?[name] ***family history includes Colon cancer in her paternal uncle; Liver disease in her brother. There is no history of Colon polyps, Esophageal cancer, Rectal cancer, or Stomach cancer. ? ?ROS:   ?Please see the history of present illness.    ?*** All other systems reviewed and are negative. ? ?  EKGs/Labs/Other Studies Reviewed:   ? ?The following studies were reviewed today: ?*** ? ?EKG:  EKG is *** ordered today.  The ekg ordered today demonstrates *** ? ?Recent Labs: ?No results found for requested labs within last 8760 hours.  ?Recent Lipid Panel ?No results found for: CHOL, TRIG, HDL, CHOLHDL, VLDL, LDLCALC, LDLDIRECT ? ? ?Risk Assessment/Calculations:   ?{Does this patient have ATRIAL FIBRILLATION?:[id number]} ? ?    ? ?Physical Exam:   ? ?VS:  LMP 01/04/2013 Comment: menopausal   ? ?Wt Readings from Last 3 Encounters:  ?01/16/22 178 lb (80.7 kg)  ?08/16/21 170 lb 4 oz (77.2 kg)  ?05/07/21 171 lb 3.2 oz (77.7 kg)  ?  ? ?GEN: *** Well nourished, well developed in no acute distress ?HEENT: Normal ?NECK: No JVD; No carotid bruits ?LYMPHATICS: No lymphadenopathy ?CARDIAC: ***RRR, no  murmurs, rubs, gallops ?RESPIRATORY:  Clear to auscultation without rales, wheezing or rhonchi  ?ABDOMEN: Soft, non-tender, non-distended ?MUSCULOSKELETAL:  No edema; No deformity  ?SKIN: Warm and dry ?NEUROLOGIC:  Alert and oriented x 3 ?PSYCHIATRIC:  Normal affect  ? ?ASSESSMENT:   ? ?No diagnosis found. ?PLAN:   ? ?In order of problems listed above: ? ?#Palpitations: ?-Check zio monitor ?-? TTE ? ?#HLD: ?-Continue crestor '20mg'$  daily ? ?#NAFLD: ?-Followed by GI ?-Continue lifestyle modifications and weight loss efforts ? ?#Obesity: ?-Discussed lifestyle modifications today ?-Will look into coverage of GLP-1 agonist ? ? ?   ? ?{Are you ordering a CV Procedure (e.g. stress test, cath, DCCV, TEE, etc)?   Press F2        :[id number]}  ? ? ?Medication Adjustments/Labs and Tests Ordered: ?Current medicines are reviewed at length with the patient today.  Concerns regarding medicines are outlined above.  ?No orders of the defined types were placed in this encounter. ? ?No orders of the defined types were placed in this encounter. ? ? ?There are no Patient Instructions on file for this visit.  ? ?Signed, ?Freada Bergeron, MD  ?02/11/2022 1:03 PM    ?[facility] ?

## 2022-02-17 ENCOUNTER — Encounter: Payer: Self-pay | Admitting: Cardiology

## 2022-02-17 ENCOUNTER — Ambulatory Visit (INDEPENDENT_AMBULATORY_CARE_PROVIDER_SITE_OTHER): Payer: BC Managed Care – PPO | Admitting: Cardiology

## 2022-02-17 VITALS — BP 118/74 | HR 65 | Ht 64.0 in | Wt 175.4 lb

## 2022-02-17 DIAGNOSIS — I639 Cerebral infarction, unspecified: Secondary | ICD-10-CM | POA: Diagnosis not present

## 2022-02-17 DIAGNOSIS — E782 Mixed hyperlipidemia: Secondary | ICD-10-CM | POA: Diagnosis not present

## 2022-02-17 DIAGNOSIS — K760 Fatty (change of) liver, not elsewhere classified: Secondary | ICD-10-CM | POA: Diagnosis not present

## 2022-02-17 DIAGNOSIS — D352 Benign neoplasm of pituitary gland: Secondary | ICD-10-CM

## 2022-02-17 DIAGNOSIS — C73 Malignant neoplasm of thyroid gland: Secondary | ICD-10-CM

## 2022-02-17 NOTE — Patient Instructions (Signed)
Medication Instructions:  ? ?Your physician recommends that you continue on your current medications as directed. Please refer to the Current Medication list given to you today. ? ?*If you need a refill on your cardiac medications before your next appointment, please call your pharmacy* ? ? ?You have been referred to SEE ONE OF OUR ELECTROPHYSIOLOGIST HERE IN THE OFFICE--FOR DISCUSSION INTERNAL LOOP RECORDER ? ? ?Follow-Up: ?At [facility], you and your health needs are our priority.  As part of our continuing mission to provide you with exceptional heart care, we have created designated Provider Care Teams.  These Care Teams include your primary Cardiologist (physician) and Advanced Practice Providers (APPs -  Physician Assistants and Nurse Practitioners) who all work together to provide you with the care you need, when you need it. ? ?We recommend signing up for the patient portal called "MyChart".  Sign up information is provided on this After Visit Summary.  MyChart is used to connect with patients for Virtual Visits (Telemedicine).  Patients are able to view lab/test results, encounter notes, upcoming appointments, etc.  Non-urgent messages can be sent to your provider as well.   ?To learn more about what you can do with MyChart, go to [email or web address].   ? ?Your next appointment:   ?6 month(s) ? ?The format for your next appointment:   ?In Person ? ?Provider:   ?DR. PEMBERTON ? ?Important Information About Sugar ? ? ? ? ? ? ?

## 2022-02-17 NOTE — Progress Notes (Signed)
?Cardiology Office Note:   ? ?Date:  02/17/2022  ? ?ID:  Kristina Greene, DOB 01/17/1961, MRN [id number] ? ?PCP:  Sherrilee Gilles, DO ?  ?[facility] Providers ?Cardiologist:  None {  ? ?Referring MD: Sherrilee Gilles, DO  ? ? ?History of Present Illness:   ? ?Kristina Greene is a 61 y.o. female with a hx of NAFLD and GERD who was referred by Dr. Sallee Provencal for further evaluation of prior stroke.  ? ?Referral notes from Dr. Sallee Provencal reviewed. Noted to have a history of CVA without residual deficits. At her visit 11/12/2021 it was recommended to consider placement of a loop recorder. ? ?Lab work 07/2021: ?Total cholesterol - 189 ?HDL - 66 ?LDL - 110 ?TG - 76 ?A1C - 5.3 ? ?She had a 21 day cardiac monitor in 05/2021 that showed 1% PVC burden and <1% PACs; no Afib. A transcranial doppler showed positive bubble test with valsalva. She had a TTE 07/2020 which showed EF 55-60%, normal RV, negative bubble study, and no significant valve disease. Also she had a stress Echo 03/2020 that was normal.  ? ?She is accompanied by a family member. Today, the patient states that she is doing pretty well. She has had extensive work-up for CVA that was noted on MRI scan as detailed below. She would like to discuss options of further work-up. ? ?She reports a history of a thyroidectomy in 2009 at [facility], and a pituitary adenoma in 2018. Her MRI in 09/2018 was reportedly stable. In 2021 her repeat MRI showed she had a a small CVA. Of note, she states that one month prior she had an episode of slurring speech and arm pain. She thought maybe that was the episode associated with the lesion, however, per review with her NSGY, the lesion appeared older. Per patient report, the MRI scan did not show this lesion after her pituitary surgery and was not deemed to be a complication from the procedure. She has had multiple cardiac monitors with no evidence of Afib. Underwent TTE with bubble which was negative for PFO. Transcranial doppler was positive with  valsalva but deemed likely to not be cause of underlying CVA.  ? ?She has a history of thyroid cancer and has undergone thyroidectomy. Previously she was taking both synthroid and liothyronine. Her liothyronine was stopped due to concern for causing palpitations and risk of Afib. ? ?She denies any chest pain, or shortness of breath. No lightheadedness, headaches, syncope, orthopnea, or PND. ? ? ?Past Medical History:  ?Diagnosis Date  ? Abdominal pain   ? Allergy   ? BMI 30.0-30.9,adult   ? Fatty liver   ? Gastritis   ? mild chronic  ? GERD (gastroesophageal reflux disease)   ? occasionally  ? Hepatic steatosis   ? without steatohepatitis  ? History of thyroid cancer   ? Hx of colonic polyps 08/11/2016  ? 12 mm ssp 2017  ? Hyperlipidemia   ? Hypothyroidism   ? Impaired fasting glucose   ? Multiple allergies   ? Obesity   ? Palpitations   ? Pancreatitis   ? distant history  ? Pituitary macroadenoma ([place])   ? Stroke [facility])   ? 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 Thyroid nodule   ? Vitamin D deficiency   ? ? ?Past Surgical History:  ?Procedure Laterality Date  ? APPENDECTOMY  1986  ? austin bunionectomy Left 06-21-2013  ? BREAST BIOPSY  09/27/2012  ? Procedure: BREAST BIOPSY  WITH NEEDLE LOCALIZATION;  Surgeon: Haywood Lasso, MD;  Location: [place];  Service: General;  Laterality: Right;  ? BREAST BIOPSY Right 05/08/2011  ? BREAST EXCISIONAL BIOPSY Right 09/27/2012  ? BUNIONECTOMY Right 09-13-2013  ? COLONOSCOPY    ? ESOPHAGOGASTRODUODENOSCOPY    ? HAMMER TOE SURGERY Left   ? 2nd, 3rd, 4th, 5th  ? HAMMER TOE SURGERY Right 09-13-2013  ? METATARSAL OSTEOTOMY Left 06-21-2013  ? 5th  ? POLYPECTOMY    ? THYROIDECTOMY  2009  ? TRANSPHENOIDAL PITUITARY RESECTION  02/2017  ? UPPER GASTROINTESTINAL ENDOSCOPY    ? ? ?Current Medications: ?Current Meds  ?Medication Sig  ? aspirin EC 81 MG tablet Take 81 mg by mouth daily. Swallow whole.  ? Cholecalciferol (VITAMIN D-3 PO) Take 1 capsule  by mouth daily. 5000 IU  ? Coenzyme Q10 (COQ10) 100 MG CAPS Take 1 capsule by mouth daily.  ? levothyroxine (SYNTHROID) 100 MCG tablet Take 100 mcg by mouth daily before breakfast. Take 6 days a week Mon through Sat  ? Multiple Vitamin (MULTIVITAMIN) capsule Take 1 capsule by mouth daily.  ? rosuvastatin (CRESTOR) 20 MG tablet Take 20 mg by mouth daily.  ? trimethoprim (TRIMPEX) 100 MG tablet Take 100 mg by mouth at bedtime.  ? vitamin B-12 (CYANOCOBALAMIN) 1000 MCG tablet Take 1,000 mcg by mouth daily.  ? [DISCONTINUED] levothyroxine (SYNTHROID) 100 MCG tablet   ?  ? ?Allergies:   Molds & smuts and Pollen extract  ? ?Social History  ? ?Socioeconomic History  ? Marital status: Married  ?  Spouse name: Not on file  ? Number of children: Not on file  ? Years of education: Not on file  ? Highest education level: Not on file  ?Occupational History  ?  Employer: [employer]  ?Tobacco Use  ? Smoking status: Never  ? Smokeless tobacco: Never  ?Vaping Use  ? Vaping Use: Never used  ?Substance and Sexual Activity  ? Alcohol use: No  ? Drug use: No  ? Sexual activity: Not on file  ?Other Topics Concern  ? Not on file  ?Social History Narrative  ? Originally from [place] in [place]  ? Married  ? + children  ? Has been employed at [employer]  ? No alcohol never smoker 3-4 caffeinated beverages daily  ? ?Social Determinants of Health  ? ?Financial Resource Strain: Not on file  ?Food Insecurity: Not on file  ?Transportation Needs: Not on file  ?Physical Activity: Not on file  ?Stress: Not on file  ?Social Connections: Not on file  ?  ? ?Family History: ?[name] family history includes Colon cancer in her paternal uncle; Liver disease in her brother. There is no history of Colon polyps, Esophageal cancer, Rectal cancer, or Stomach cancer. ? ?ROS:   ?Review of Systems  ?Constitutional:  Negative for chills and fever.  ?HENT:  Negative for sore throat and tinnitus.   ?Eyes:  Negative for pain.  ?Respiratory:   Negative for cough and shortness of breath.   ?Cardiovascular:  Positive for palpitations and leg swelling. Negative for chest pain, orthopnea, claudication and PND.  ?Gastrointestinal:  Negative for nausea and vomiting.  ?Genitourinary:  Negative for hematuria.  ?Musculoskeletal:  Positive for neck pain. Negative for falls.  ?Neurological:  Negative for seizures and weakness.  ?Endo/Heme/Allergies:  Negative for polydipsia.  ?Psychiatric/Behavioral:  The patient is not nervous/anxious and does not have insomnia.   ? ? ?EKGs/Labs/Other Studies Reviewed:   ? ?The following  studies were reviewed today: ? ?CTA Head/Neck 05/10/2021 ([place]): ?IMPRESSION:  ? ?1. Encephalomalacia within the lateral right temporal lobe, likely chronic infarct.  ?2. No large vessel occlusion, aneurysm, or flow-limiting stenosis within the vessels of the head and neck.  ?3. Postoperative changes of a prior transsphenoidal hypophysectomy. The right sellar and cavernous sinus lesion is better seen on previous MRI dated 04/18/2021.  ? ?Cardiac Monitor 08/2020: ?This result has an attachment that is not available.  ?The patient wore a looping event recorder from 11/2 to 09/01/20 for  ?evaluation after a stroke.  There were 11 total tracings available for  ?review including the baseline tracing which demonstrated sinus rhythm with  ?normal intervals.  There was no atrial fibrillation detected, although  ?there were occasional PACs with a few brief atrial runs (4 - 10 beats).    ?Patient compliance was 78% with 225 hours of recording time.  ? ?Overall interpretation of this cardiac event monitor is normal as there  ?were no high-grade brady- or tachy-arrhythmias detected.  ? ?I have personally reviewed the tracings and I agree with the documented  ?findings and interpretation.  I have edited the report as appropriate.  ? ? ?TTE 07/2020: ?HISTORY  ?Stroke.  ?-  ?PROCEDURE  ?A two-dimensional transthoracic echocardiogram with color  flow and  ?Doppler was performed.  ?-  ?SUMMARY  ?The left ventricular size is normal.  ?LV ejection fraction = 55-60%.  ?Left ventricular systolic function is normal.  ?The right ventricle is normal in size

## 2022-02-20 ENCOUNTER — Ambulatory Visit (HOSPITAL_COMMUNITY)
Admission: RE | Admit: 2022-02-20 | Discharge: 2022-02-20 | Disposition: A | Discharge status: Home / Self Care | Payer: BC Managed Care – PPO | Source: Ambulatory Visit | Attending: Internal Medicine | Admitting: Internal Medicine

## 2022-02-20 DIAGNOSIS — K769 Liver disease, unspecified: Secondary | ICD-10-CM | POA: Diagnosis present

## 2022-02-20 DIAGNOSIS — R932 Abnormal findings on diagnostic imaging of liver and biliary tract: Secondary | ICD-10-CM | POA: Insufficient documentation

## 2022-02-20 DIAGNOSIS — K760 Fatty (change of) liver, not elsewhere classified: Secondary | ICD-10-CM | POA: Diagnosis not present

## 2022-02-20 IMAGING — MR MR ABDOMEN WO/W CM
21 series · 48 of 48 positions shown · IV contrast (7ml GADAVIST)
Comparison: MRI abdomen [DATE], abdominal ultrasound [DATE]

CLINICAL DATA: Liver lesion

EXAM:
MRI ABDOMEN WITHOUT AND WITH CONTRAST
TECHNIQUE: Multiplanar multisequence MR imaging of the abdomen was performed
both before and after the administration of intravenous contrast.
CONTRAST:  7mL GADAVIST GADOBUTROL 1 MMOL/ML IV SOLN

[Series 3: DWI · axial · 6.0mm · 1.49mm/px · 1 of 80 slices shown (1 of 2)]
[im 1/80]
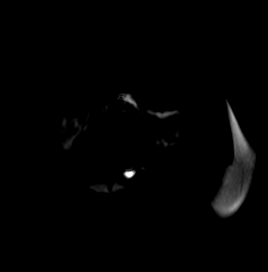

[Series 4: DWI · axial · 6.0mm · 1.49mm/px · 1 of 40 slices shown (2 of 2)]
[im 1/40]
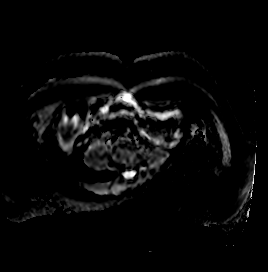

[Series 5: T2 fat-sat · axial · 6.0mm · 1.25mm/px · 1 of 40 slices shown]
[im 1/40]
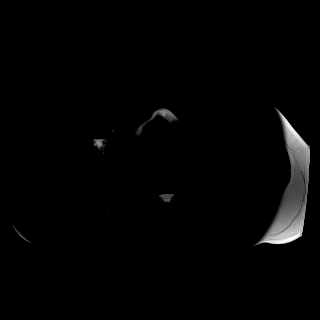

[Series 7: T2 · coronal · 6.0mm · 1.56mm/px · 1 of 34 slices shown (1 of 2)]
[im 1/34]
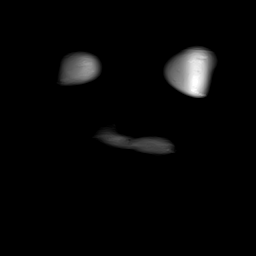

[Series 8: T1 · axial · 3.0mm · 1.25mm/px · z∈[-81,+180]mm · 2 of 88 slices shown (1 of 2)]
[im 1/88]
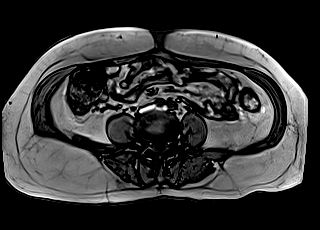
[im 88/88]
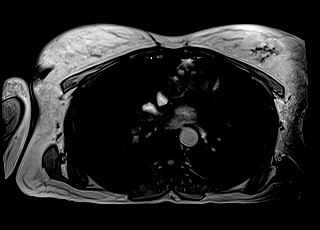

[Series 9: T1 · axial · 3.0mm · 1.25mm/px · z∈[-81,+180]mm · 2 of 88 slices shown (2 of 2)]
[im 1/88]
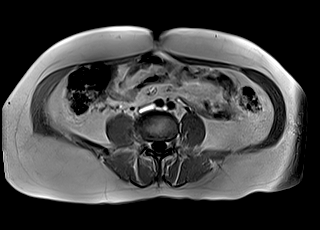
[im 88/88]
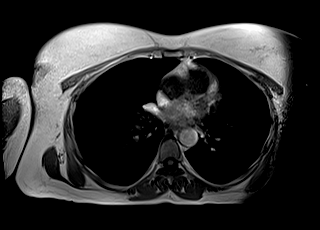

[Series 10: bSSFP · axial · 4.0mm · 0.84mm/px · z∈[-5,+271]mm · 2 of 70 slices shown (1 of 2)]
[im 1/70]
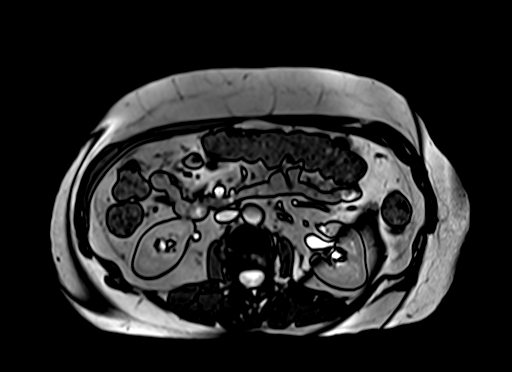
[im 70/70]
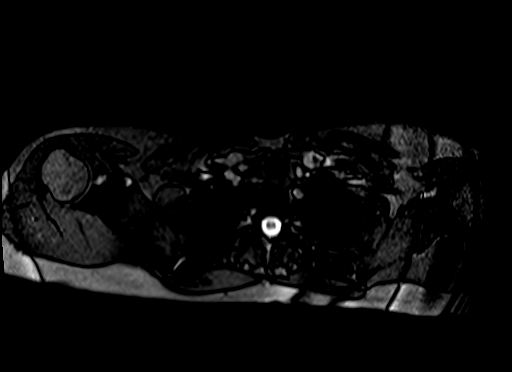

[Series 11: bSSFP · axial · 4.0mm · 0.84mm/px · z∈[-78,+198]mm · 2 of 70 slices shown (2 of 2)]
[im 1/70]
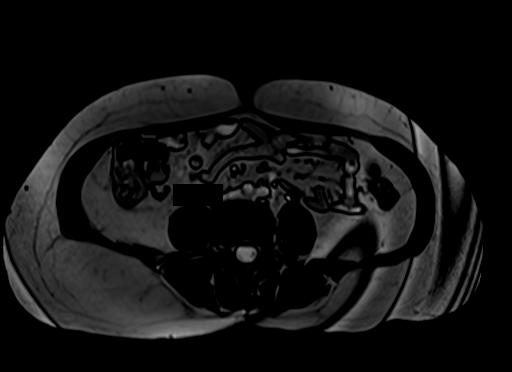
[im 70/70]
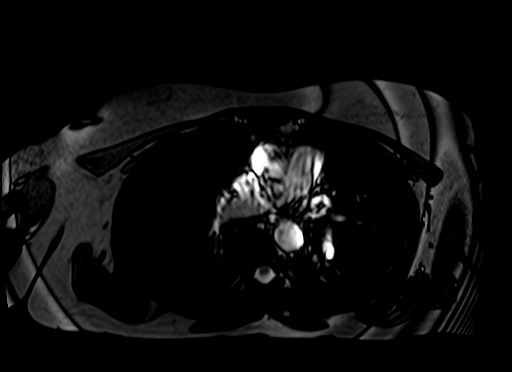

[Series 13: T1 dynamic · axial · 3.0mm · 1.25mm/px · z∈[-90,+195]mm · 3 of 96 slices shown (1 of 12)]
[im 1/96]
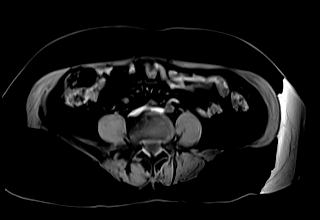
[im 48/96]
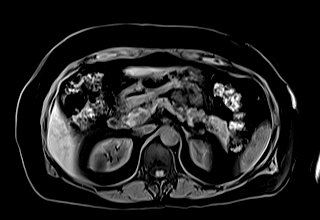
[im 96/96]
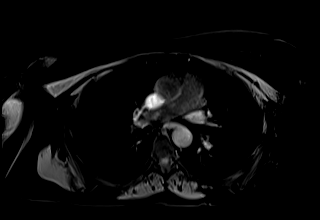

[Series 17: T1 dynamic · axial · 3.0mm · 1.25mm/px · z∈[-90,+195]mm · 3 of 96 slices shown (2 of 12)]
[im 1/96]
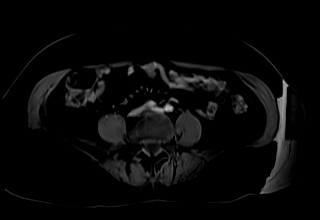
[im 48/96]
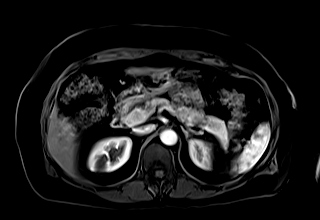
[im 96/96]
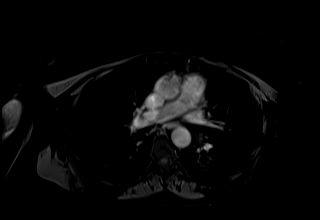

[Series 18: T1 dynamic · axial · 3.0mm · 1.25mm/px · z∈[-90,+195]mm · 3 of 96 slices shown (3 of 12)]
[im 1/96]
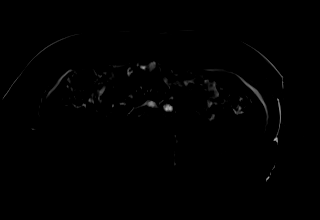
[im 48/96]
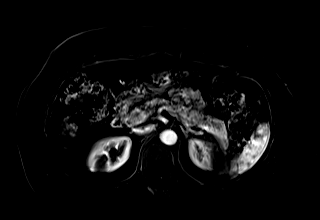
[im 96/96]
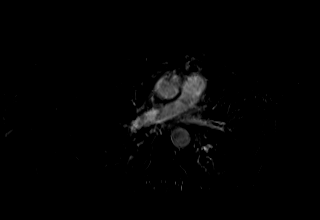

[Series 21: T1 dynamic · axial · 3.0mm · 1.25mm/px · z∈[-90,+195]mm · 3 of 96 slices shown (4 of 12)]
[im 1/96]
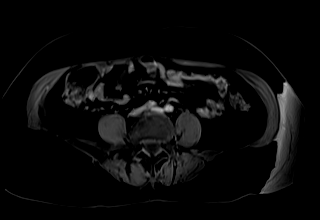
[im 48/96]
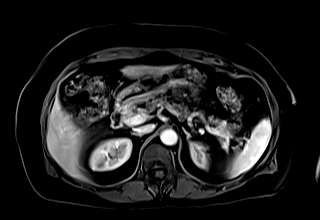
[im 96/96]
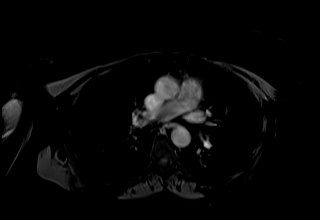

[Series 22: T1 dynamic · axial · 3.0mm · 1.25mm/px · z∈[-90,+195]mm · 3 of 96 slices shown (5 of 12)]
[im 1/96]
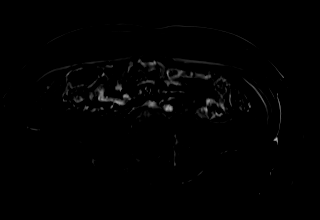
[im 48/96]
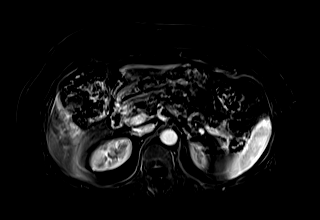
[im 96/96]
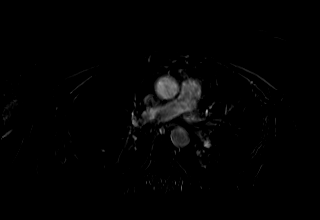

[Series 25: T1 dynamic · axial · 3.0mm · 1.25mm/px · z∈[-90,+195]mm · 3 of 96 slices shown (6 of 12)]
[im 1/96]
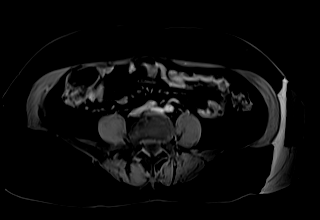
[im 48/96]
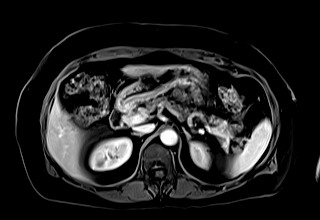
[im 96/96]
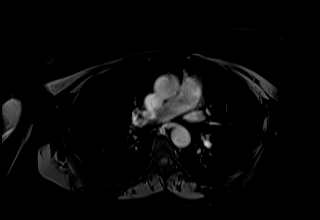

[Series 26: T1 dynamic · axial · 3.0mm · 1.25mm/px · z∈[-90,+195]mm · 3 of 96 slices shown (7 of 12)]
[im 1/96]
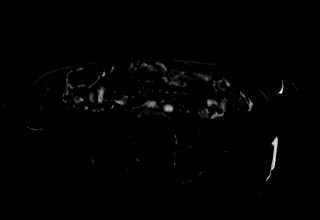
[im 48/96]
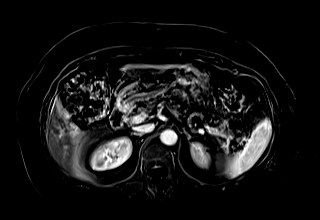
[im 96/96]
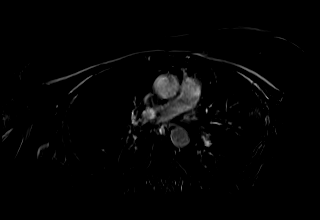

[Series 28: T1 dynamic · coronal · 4.0mm · 1.41mm/px · 2 of 60 slices shown (8 of 12)]
[im 1/60]
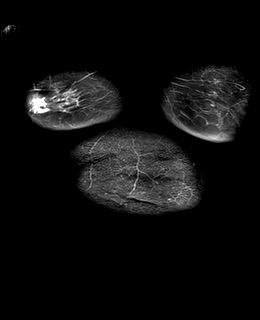
[im 60/60]
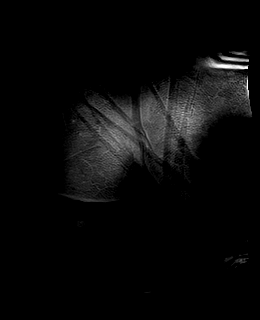

[Series 29: T2 · axial · 6.0mm · 1.56mm/px · 1 of 42 slices shown (2 of 2)]
[im 1/42]
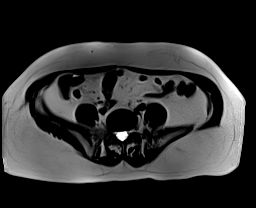

[Series 32: T1 dynamic · axial · 3.0mm · 1.25mm/px · z∈[-90,+195]mm · 3 of 96 slices shown (9 of 12)]
[im 1/96]
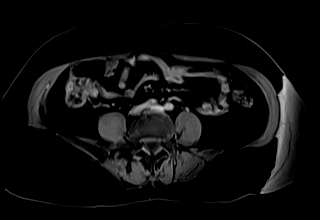
[im 48/96]
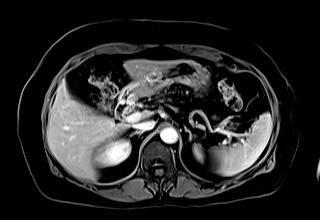
[im 96/96]
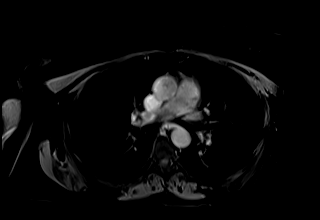

[Series 33: T1 dynamic · axial · 3.0mm · 1.25mm/px · z∈[-90,+195]mm · 3 of 96 slices shown (10 of 12)]
[im 1/96]
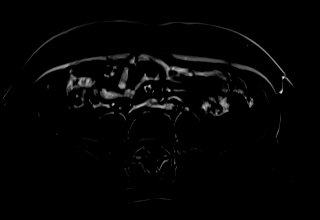
[im 48/96]
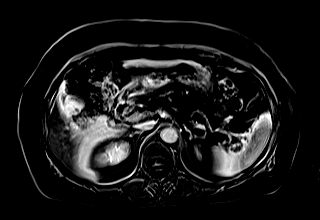
[im 96/96]
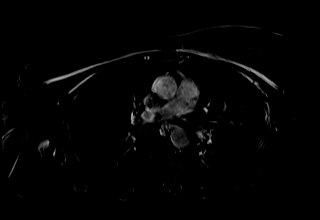

[Series 36: T1 dynamic · axial · 3.0mm · 1.25mm/px · z∈[-90,+195]mm · 3 of 96 slices shown (11 of 12)]
[im 1/96]
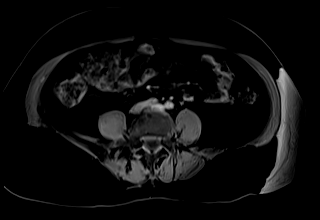
[im 48/96]
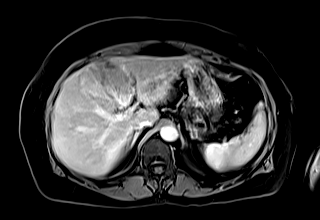
[im 96/96]
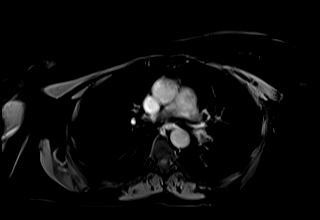

[Series 37: T1 dynamic · axial · 3.0mm · 1.25mm/px · z∈[-90,+195]mm · 3 of 96 slices shown (12 of 12)]
[im 1/96]
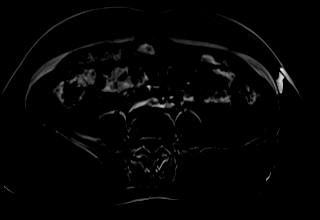
[im 48/96]
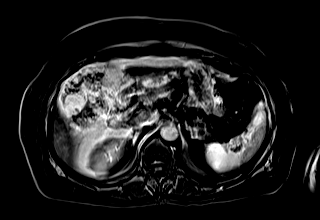
[im 96/96]
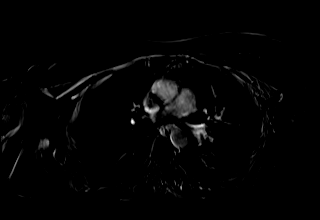

[48 of 48 positions shown; findings below may reference images not displayed]

FINDINGS: Lower chest: No acute findings.

Hepatobiliary: Liver is normal in size and contour. Inhomogeneous
parenchyma with evidence of hepatic steatosis, area on previous
ultrasound likely represents relative fatty sparing. No suspicious
hepatic mass visualized. Gallbladder demonstrates a Phrygian cap
which contains sludge. No biliary ductal dilatation.

Pancreas: No mass, inflammatory changes, or other parenchymal
abnormality identified.

Spleen:  Within normal limits in size and appearance.

Adrenals/Urinary Tract: Adrenal glands appear normal. 2.8 cm right
renal cortical cyst. No other enhancing renal mass or hydronephrosis
identified bilaterally.

Stomach/Bowel: Mild colonic diverticulosis.

Vascular/Lymphatic: No pathologically enlarged lymph nodes
identified. No abdominal aortic aneurysm demonstrated.

Other:  No ascites.

Musculoskeletal: No suspicious bone lesions identified.
IMPRESSION: 1. Hepatic steatosis.  No suspicious hepatic mass identified.
2. Gallbladder Phrygian cap which contains sludge.
3. Right renal cyst.
4. Mild colonic diverticulosis.

## 2022-02-20 MED ORDER — GADOBUTROL 1 MMOL/ML IV SOLN
7.0000 mL | Freq: Once | INTRAVENOUS | Status: AC | PRN
  Administered 2022-02-20: 7 mL via INTRAVENOUS

## 2022-03-03 ENCOUNTER — Ambulatory Visit (HOSPITAL_COMMUNITY): Payer: BC Managed Care – PPO

## 2022-03-04 ENCOUNTER — Institutional Professional Consult (permissible substitution): Payer: BC Managed Care – PPO | Admitting: Cardiology

## 2022-03-28 ENCOUNTER — Telehealth: Payer: Self-pay

## 2022-03-28 NOTE — Telephone Encounter (Signed)
Dr. Carlean Purl please see consult notes in chart under media from 03/26/22

## 2022-04-30 ENCOUNTER — Encounter: Payer: Self-pay | Admitting: Internal Medicine

## 2022-04-30 ENCOUNTER — Ambulatory Visit (INDEPENDENT_AMBULATORY_CARE_PROVIDER_SITE_OTHER): Payer: BC Managed Care – PPO | Admitting: Internal Medicine

## 2022-04-30 VITALS — BP 90/60 | HR 71 | Ht 64.0 in | Wt 176.4 lb

## 2022-04-30 DIAGNOSIS — E8881 Metabolic syndrome: Secondary | ICD-10-CM | POA: Diagnosis not present

## 2022-04-30 DIAGNOSIS — K760 Fatty (change of) liver, not elsewhere classified: Secondary | ICD-10-CM | POA: Diagnosis not present

## 2022-04-30 NOTE — Progress Notes (Unsigned)
Kristina Greene 61 y.o. [birth year] [id number]  Assessment & Plan:   Encounter Diagnoses  Name Primary?   NAFLD (nonalcoholic fatty liver disease) Yes   Insulin resistance    I have suggested she pursue a low carbohydrate eating plan.  I have suggested she see Dr. Enrigue Greene in consultation.  We have found that he is booking in December at this point and there is a long waiting list.  This is not urgent.  She has tried some things on her own but I think she is going to need counseling and continuing care for this.  The other options would be online services.  I can see her back in a year to reassess.  I did my best to reassure her today that at this point though insulin resistance and nonalcoholic fatty liver disease are medical problems that need to be addressed her liver seems to be in good shape currently and that this is a slow-moving process and the vast majority of people with nonalcoholic fatty liver disease do not get cirrhosis and its complications.  She also has some favorable metabolic findings even though she had not fasting insulin level above 12.    Another option would be [place] which is a company that provides [employer] teaching education and has proven success in randomized control trials and reversing diabetes and prediabetes in patients with metabolic syndrome.  She was given that name and asked to look into that as well.  Some insurers cover it and it is available for purchase without insurance as well.  GLP-1 agonist like Ozempic may have a role I am not convinced we have good evidence to support the use of these as we do not have great long-term data and it would require chronic treatment.  CC: Kristina Gilles, DO    Chief Complaint: Follow-up of MRI with hepatic steatosis/nonalcoholic fatty liver disease  HPI The patient is a 61 year old woman with history of thyroid cancer, pituitary macroadenoma and previous silent stroke here with her husband, having been  seen for NAFLD, history of colon polyps, abdominal pain issues over the past few years and she is here today to review recent MRI findings.  These and the ultrasound that led to the MRI are listed below:  IMPRESSION: 1. Hepatic steatosis.  No suspicious hepatic mass identified. 2. Gallbladder Phrygian cap which contains sludge. 3. Right renal cyst. 4. Mild colonic diverticulosis.    IMPRESSION: ULTRASOUND ABDOMEN:   Diffuse hepatic steatosis. Wedge-shaped hypoechoic area in the right lobe of the liver near the gallbladder fossa measuring 5.2 cm most likely reflects focal fatty sparing. If clinically indicated this could be more definitively characterized by hepatic protocol MRI with and without contrast.   ULTRASOUND HEPATIC ELASTOGRAPHY:   Median kPa: 2.8   Diagnostic category:  < or = 5 kPa: high probability of being normal     Electronically Signed   By: Kristina Greene M.D.   On: 02/03/2022 16:26  She is concerned about the status of her liver disease and what she can do to reverse the steatosis.  She had seen an endocrinologist at [facility] who suggested possibly using Ozempic and I was queried about that online and explained that I do not prescribe that.  Recall that previous labs that showed an insulin level (fasting) of 12 consistent with insulin resistance.  LFTs have been normal for some time now though they are high normal and not in the 20 or less range on the transaminases which  are at goal really.  She had a lipoprotein analysis when she had her insulin level in 2022 and the LP-IR score was 27 which is on the lower side.  Allergies  Allergen Reactions   Molds & Smuts Itching, Swelling and Other (See Comments)    Other reaction(s): Cough (ALLERGY/intolerance)-- pT STATES MOLDS  Rhinorrhea and itchy eyes Rhinorrhea and itchy eyes    Pollen Extract Other (See Comments)    Rhinorrhea and itchy eyes Rhinorrhea and itchy eyes Rhinorrhea and itchy eyes Rhinorrhea  and itchy eyes   Current Meds  Medication Sig   aspirin EC 81 MG tablet Take 81 mg by mouth daily. Swallow whole.   Cholecalciferol (VITAMIN D-3 PO) Take 1 capsule by mouth daily. 5000 IU   Coenzyme Q10 (COQ10) 100 MG CAPS Take 1 capsule by mouth daily.   levothyroxine (SYNTHROID) 100 MCG tablet Take 100 mcg by mouth daily before breakfast. Take 6 days a week Mon through Sat   Multiple Vitamin (MULTIVITAMIN) capsule Take 1 capsule by mouth daily.   omeprazole (PRILOSEC OTC) 20 MG tablet Take 20 mg by mouth. As needed   rosuvastatin (CRESTOR) 20 MG tablet Take 20 mg by mouth daily.   trimethoprim (TRIMPEX) 100 MG tablet Take 100 mg by mouth at bedtime.   vitamin B-12 (CYANOCOBALAMIN) 1000 MCG tablet Take 1,000 mcg by mouth daily.   Past Medical History:  Diagnosis Date   Abdominal pain    Allergy    BMI 30.0-30.9,adult    Fatty liver    Gastritis    mild chronic   GERD (gastroesophageal reflux disease)    occasionally   Hepatic steatosis    without steatohepatitis   History of thyroid cancer    Hx of colonic polyps 08/11/2016   12 mm ssp 2017   Hyperlipidemia    Hypothyroidism    Impaired fasting glucose    Multiple allergies    Obesity    Palpitations    Pancreatitis    distant history   Pituitary macroadenoma ([place])    Stroke ([place])    SILENT TIA  STROKE BETWEEN 09-2018- 02-2020 PER MRI PER PT    Thyroid nodule    Vitamin D deficiency    Past Surgical History:  Procedure Laterality Date   APPENDECTOMY  1986   [place] bunionectomy Left 06-21-2013   BREAST BIOPSY  09/27/2012   Procedure: BREAST BIOPSY WITH NEEDLE LOCALIZATION;  Surgeon: Haywood Lasso, MD;  Location: [facility];  Service: General;  Laterality: Right;   BREAST BIOPSY Right 05/08/2011   BREAST EXCISIONAL BIOPSY Right 09/27/2012   BUNIONECTOMY Right 09-13-2013   COLONOSCOPY     ESOPHAGOGASTRODUODENOSCOPY     HAMMER TOE SURGERY Left    2nd, 3rd, 4th, 5th   HAMMER TOE SURGERY Right  09-13-2013   METATARSAL OSTEOTOMY Left 06-21-2013   5th   POLYPECTOMY     THYROIDECTOMY  2009   TRANSPHENOIDAL PITUITARY RESECTION  02/2017   UPPER GASTROINTESTINAL ENDOSCOPY     Social History   Social History Narrative   Originally from [place] in [place]   Married   + children   Has been employed at [employer]   No alcohol never smoker 3-4 caffeinated beverages daily   family history includes Colon cancer in her paternal uncle; Liver disease in her brother.   Review of Systems As per HPI  Objective:   Physical Exam BP 90/60   Pulse 71   Ht '5\' 4"'$  (1.626 m)  Wt 176 lb 6.4 oz (80 kg)   LMP 01/04/2013 Comment: menopausal  BMI 30.28 kg/m   32 minutes total time

## 2022-04-30 NOTE — Patient Instructions (Signed)
Good to see you today.  Here are recommendations for nutrition and eating change providers:  *Dr. Enrigue Catena at [facility]  Dr. Rebecca Eaton has an online practice you can check out   [employer] and [employer] are companies that can help with glucose monitoring.  *ALSO CHECK OUT [place] CARBOHYDRATE PROGRAM THAT MAY BE COVERED BY YOUR INSURANCE  I appreciate the opportunity to care for you. Gatha Mayer, MD, [facility]

## 2022-05-07 ENCOUNTER — Institutional Professional Consult (permissible substitution): Payer: BC Managed Care – PPO | Admitting: Cardiology

## 2022-05-16 ENCOUNTER — Ambulatory Visit: Payer: BC Managed Care – PPO | Admitting: Obstetrics and Gynecology

## 2022-07-03 ENCOUNTER — Other Ambulatory Visit: Payer: Self-pay | Admitting: Gynecology

## 2022-07-03 DIAGNOSIS — N644 Mastodynia: Secondary | ICD-10-CM

## 2022-08-20 ENCOUNTER — Ambulatory Visit: Payer: BC Managed Care – PPO | Admitting: Cardiology

## 2022-09-03 ENCOUNTER — Other Ambulatory Visit: Payer: BC Managed Care – PPO

## 2022-09-22 ENCOUNTER — Other Ambulatory Visit: Payer: BC Managed Care – PPO

## 2022-10-01 ENCOUNTER — Other Ambulatory Visit: Payer: BC Managed Care – PPO

## 2022-10-29 ENCOUNTER — Ambulatory Visit: Payer: BC Managed Care – PPO | Admitting: Obstetrics and Gynecology

## 2022-11-24 ENCOUNTER — Ambulatory Visit: Admission: RE | Admit: 2022-11-24 | Payer: BC Managed Care – PPO | Source: Ambulatory Visit

## 2022-11-24 ENCOUNTER — Ambulatory Visit: Payer: BC Managed Care – PPO

## 2022-11-24 ENCOUNTER — Other Ambulatory Visit: Payer: BC Managed Care – PPO

## 2022-11-24 ENCOUNTER — Ambulatory Visit
Admission: RE | Admit: 2022-11-24 | Discharge: 2022-11-24 | Disposition: A | Discharge status: Home / Self Care | Payer: BC Managed Care – PPO | Source: Ambulatory Visit | Attending: Gynecology | Admitting: Gynecology

## 2022-11-24 DIAGNOSIS — N644 Mastodynia: Secondary | ICD-10-CM

## 2022-12-08 ENCOUNTER — Encounter: Payer: Self-pay | Admitting: *Deleted

## 2022-12-16 ENCOUNTER — Ambulatory Visit (INDEPENDENT_AMBULATORY_CARE_PROVIDER_SITE_OTHER): Payer: BC Managed Care – PPO | Admitting: Obstetrics and Gynecology

## 2022-12-16 ENCOUNTER — Encounter: Payer: Self-pay | Admitting: Obstetrics and Gynecology

## 2022-12-16 VITALS — BP 110/75 | HR 66 | Ht 64.0 in | Wt 171.0 lb

## 2022-12-16 DIAGNOSIS — R3915 Urgency of urination: Secondary | ICD-10-CM

## 2022-12-16 DIAGNOSIS — R350 Frequency of micturition: Secondary | ICD-10-CM

## 2022-12-16 DIAGNOSIS — M62838 Other muscle spasm: Secondary | ICD-10-CM

## 2022-12-16 DIAGNOSIS — N952 Postmenopausal atrophic vaginitis: Secondary | ICD-10-CM

## 2022-12-16 DIAGNOSIS — N393 Stress incontinence (female) (male): Secondary | ICD-10-CM

## 2022-12-16 DIAGNOSIS — N390 Urinary tract infection, site not specified: Secondary | ICD-10-CM

## 2022-12-16 LAB — POCT URINALYSIS DIPSTICK
Bilirubin, UA: NEGATIVE
Blood, UA: POSITIVE
Glucose, UA: NEGATIVE
Ketones, UA: NEGATIVE
Leukocytes, UA: NEGATIVE
Nitrite, UA: NEGATIVE
Protein, UA: NEGATIVE
Spec Grav, UA: 1.01 (ref 1.010–1.025)
Urobilinogen, UA: 0.2 E.U./dL
pH, UA: 6 (ref 5.0–8.0)

## 2022-12-16 MED ORDER — ESTRADIOL 0.1 MG/GM VA CREA: TOPICAL_CREAM | VAGINAL | 11 refills | Status: DC

## 2022-12-16 NOTE — Patient Instructions (Signed)
Start vaginal estrogen therapy nightly for two weeks then 2 times weekly at night for treatment of vaginal atrophy (dryness of the vaginal tissues).  Please let us know if the prescription is too expensive and we can look for alternative options.    A referral has been placed to pelvic PT. Notify us of the kind of muscle relaxant that you have used previously so we can provide a refill.

## 2022-12-16 NOTE — Progress Notes (Signed)
[facility] New Patient Evaluation and Consultation  Referring Provider: Michell Heinrich, DO PCP: Sherrilee Gilles, DO Date of Service: 12/16/2022  SUBJECTIVE Chief Complaint: New Patient (Initial Visit) Kristina Greene is a 62 y.o. female here for pelvic floor dysfunction. Patient also stated she has been having Hematuria. Diagnosed with OAB.)  History of Present Illness: Kristina Greene is a 62 y.o. female seen in consultation presenting for evaluation of chronic pelvic pain and incontinence.    Review of records significant for: Has done two sessions of pelvic PT in 2022  Urinary Symptoms: Leaks urine with cough/ sneeze, exercise, and with urgency Does not leak every day. SUI > UUI Pad use:  liners/ mini-pads She is bothered by her UI symptoms.  Day time voids 5-6.  Nocturia: 1 times per night to void. Voiding dysfunction: she empties her bladder well.  does not use a catheter to empty bladder.  When urinating, she feels she has no difficulties Drinks: 1 cup coffee, 1 cup green tea in afternoon, 3+ bottles water per day, sometimes milk with honey  UTIs: 4 UTI's in the last year.  Was treated by her PCP, she believes they sent urine culture.  She is not using any vaginal estrogen Reports history of blood in urine- had a trace of blood in the urine. Had seen a urologist for this and was told the level of blood in urine was not concerning after further testing.  Also seen by Dr Roni Bread at [facility]- noted she had OAB and recommended PT which she did at their office.   Pelvic Organ Prolapse Symptoms:                  She Denies a feeling of a bulge the vaginal area.   Bowel Symptom: Bowel movements: 3+ time(s) per day Stool consistency: soft  Straining: no.  Splinting: no.  Incomplete evacuation: no.  She Denies accidental bowel leakage / fecal incontinence Bowel regimen: fiber  Sexual Function Sexually active: yes.  Sexual orientation: Straight Pain with sex: Yes, at the  vaginal opening, deep in the pelvis  Pelvic Pain Admits to pelvic pain Location: pelvic bone, lower abdomen and groin, mostly on the right side.  Pain occurs: irregularly Prior pain treatment: pelvic PT Improved by: medication (ibuprofen), pelvic exercises Worsened by: sometimes exercise  Seen by GYN in [place], and had an ultrasound around 2020  Had been seen by Dr Carlean Purl and ordered some imaging. Recommended pelvic PT.    Past Medical History:  Past Medical History:  Diagnosis Date   Abdominal pain    Allergy    BMI 30.0-30.9,adult    Fatty liver    Gastritis    mild chronic   GERD (gastroesophageal reflux disease)    occasionally   Hepatic steatosis    without steatohepatitis   History of thyroid cancer    Hx of colonic polyps 08/11/2016   12 mm ssp 2017   Hyperlipidemia    Hypothyroidism    Impaired fasting glucose    Multiple allergies    Obesity    Palpitations    Pancreatitis    distant history   Pituitary macroadenoma ([place])    Stroke ([place])    SILENT TIA  STROKE BETWEEN 09-2018- 02-2020 PER MRI PER PT    Thyroid nodule    Vitamin D deficiency      Past Surgical History:   Past Surgical History:  Procedure Laterality Date   APPENDECTOMY  1986   [place] bunionectomy Left 06-21-2013  BREAST BIOPSY  09/27/2012   Procedure: BREAST BIOPSY WITH NEEDLE LOCALIZATION;  Surgeon: Haywood Lasso, MD;  Location: [facility];  Service: General;  Laterality: Right;   BREAST BIOPSY Right 05/08/2011   BREAST EXCISIONAL BIOPSY Right 09/27/2012   BUNIONECTOMY Right 09-13-2013   COLONOSCOPY     ESOPHAGOGASTRODUODENOSCOPY     HAMMER TOE SURGERY Left    2nd, 3rd, 4th, 5th   HAMMER TOE SURGERY Right 09-13-2013   METATARSAL OSTEOTOMY Left 06-21-2013   5th   POLYPECTOMY     THYROIDECTOMY  2009   TRANSPHENOIDAL PITUITARY RESECTION  02/2017   UPPER GASTROINTESTINAL ENDOSCOPY       Past OB/GYN History: OB History  Gravida Para Term Preterm AB Living   0 0 0 0 0 0  SAB IAB Ectopic Multiple Live Births  0 0 0 0 0   Menopausal: Denies vaginal bleeding since menopause Last pap smear was 2021.     Medications: She has a current medication list which includes the following prescription(s): aspirin ec, cholecalciferol, coq10, [START ON 12/18/2022] estradiol, levothyroxine, multivitamin, rosuvastatin, trimethoprim, cyanocobalamin, and omeprazole.   Allergies: Patient is allergic to molds & smuts and pollen extract.   Social History:  Social History   Tobacco Use   Smoking status: Never   Smokeless tobacco: Never  Vaping Use   Vaping Use: Never used  Substance Use Topics   Alcohol use: No   Drug use: No    Relationship status: married She lives with husband.   She is not employed. Regular exercise: No History of abuse: No  Family History:   Family History  Problem Relation Age of Onset   Heart attack Mother    Liver disease Brother    Colon cancer Paternal Uncle    Colon polyps Neg Hx    Esophageal cancer Neg Hx    Rectal cancer Neg Hx    Stomach cancer Neg Hx      Review of Systems: Review of Systems  Constitutional:  Negative for fever, malaise/fatigue and weight loss.  Respiratory:  Negative for cough, shortness of breath and wheezing.   Cardiovascular:  Negative for chest pain, palpitations and leg swelling.  Gastrointestinal:  Positive for abdominal pain. Negative for blood in stool.  Genitourinary:  Negative for dysuria.  Musculoskeletal:  Positive for myalgias.  Skin:  Negative for rash.  Neurological:  Positive for headaches. Negative for dizziness.  Endo/Heme/Allergies:  Bruises/bleeds easily.  Psychiatric/Behavioral:  Negative for depression. The patient is not nervous/anxious.      OBJECTIVE Physical Exam: Vitals:   12/16/22 1111  BP: 110/75  Pulse: 66  Weight: 171 lb (77.6 kg)  Height: '5\' 4"'$  (1.626 m)    Physical Exam Constitutional:      General: She is not in acute distress. Pulmonary:      Effort: Pulmonary effort is normal.  Abdominal:     General: There is no distension.     Palpations: Abdomen is soft.     Tenderness: There is no abdominal tenderness. There is no rebound.  Musculoskeletal:        General: No swelling. Normal range of motion.  Skin:    General: Skin is warm and dry.     Findings: No rash.  Neurological:     Mental Status: She is alert and oriented to person, place, and time.  Psychiatric:        Mood and Affect: Mood normal.        Behavior: Behavior normal.  GU / Detailed Urogynecologic Evaluation:  Pelvic Exam: Normal external female genitalia; Bartholin's and Skene's glands normal in appearance; urethral meatus normal in appearance, no urethral masses or discharge.   CST: negative  Q tip test- no vulvar pain, allodynia at introitus  Speculum exam reveals normal vaginal mucosa with atrophy. Cervix normal appearance. Uterus normal single, nontender. Adnexa no mass, fullness, tenderness.     Pelvic floor strength I/V  Pelvic floor musculature: Right levator tender, Right obturator tender, Left levator tender, Left obturator tender  POP-Q:   POP-Q  -3                                            Aa   -3                                           Ba  -8.5                                              C   2.5                                            Gh  4.5                                            Pb  9                                            tvl   -3                                            Ap  -3                                            Bp  -9                                              D      Rectal Exam:  Normal external rectum  Post-Void Residual (PVR) by Bladder Scan: In order to evaluate bladder emptying, we discussed obtaining a postvoid residual and she agreed to this procedure.  Procedure: The ultrasound unit was placed on the patient's abdomen in the suprapubic region after the patient had  voided. A PVR of 7 ml was obtained by bladder scan.  Laboratory Results: POC urine: trace blood, otherwise negative   ASSESSMENT AND PLAN Kristina Greene is a 62 y.o.  with:  1. Levator spasm   2. Urinary frequency   3. Urinary urgency   4. SUI (stress urinary incontinence, female)   5. Vaginal atrophy   6. Recurrent urinary tract infection    Levator spasm - The origin of pelvic floor muscle spasm can be multifactorial, including primary, reactive to a different pain source, trauma, or even part of a centralized pain syndrome.Treatment options include pelvic floor physical therapy, local (vaginal) or oral  muscle relaxants, pelvic muscle trigger point injections or centrally acting pain medications.   - She has taken a muscle relaxant in the past that she wants to try again- she will send [place] the name so we can get a refill.  - Also wants to try pelvic PT- referral placed to [facility] pelvic PT in [place].   2. Urinary urgency - We discussed reducing bladder irritants such as coffee or tea - Start with pelvic PT, if does not improve then she will consider a medication.   3. SUI - Does not have frequent symptoms, will start with pelvic PT  4. Vaginal atrophy/ recurrent UTI - She will bring the urine culture results from her PCP - Recommended starting vaginal estrace cream 0.5g nightly for two weeks then twice a week after for UTI prevention.   Return 3 months or sooner if needed  Jaquita Folds, MD

## 2023-03-17 ENCOUNTER — Ambulatory Visit: Payer: BC Managed Care – PPO | Admitting: Obstetrics and Gynecology

## 2023-12-22 HISTORY — PX: NASAL SINUS SURGERY: SHX719

## 2024-04-28 ENCOUNTER — Ambulatory Visit: Admitting: Internal Medicine

## 2024-06-13 LAB — LAB REPORT - SCANNED
A1c: 5.8
EGFR: 100

## 2024-06-15 ENCOUNTER — Ambulatory Visit (INDEPENDENT_AMBULATORY_CARE_PROVIDER_SITE_OTHER): Admitting: Internal Medicine

## 2024-06-15 ENCOUNTER — Encounter: Payer: Self-pay | Admitting: Internal Medicine

## 2024-06-15 ENCOUNTER — Ambulatory Visit: Payer: Self-pay | Admitting: Internal Medicine

## 2024-06-15 ENCOUNTER — Other Ambulatory Visit (INDEPENDENT_AMBULATORY_CARE_PROVIDER_SITE_OTHER)

## 2024-06-15 VITALS — BP 100/66 | HR 62 | Ht 64.0 in | Wt 166.8 lb

## 2024-06-15 DIAGNOSIS — R1011 Right upper quadrant pain: Secondary | ICD-10-CM | POA: Diagnosis not present

## 2024-06-15 DIAGNOSIS — D1801 Hemangioma of skin and subcutaneous tissue: Secondary | ICD-10-CM

## 2024-06-15 DIAGNOSIS — R1012 Left upper quadrant pain: Secondary | ICD-10-CM

## 2024-06-15 DIAGNOSIS — K760 Fatty (change of) liver, not elsewhere classified: Secondary | ICD-10-CM

## 2024-06-15 DIAGNOSIS — G8929 Other chronic pain: Secondary | ICD-10-CM

## 2024-06-15 DIAGNOSIS — R0789 Other chest pain: Secondary | ICD-10-CM

## 2024-06-15 DIAGNOSIS — Z860100 Personal history of colon polyps, unspecified: Secondary | ICD-10-CM

## 2024-06-15 LAB — HEPATIC FUNCTION PANEL
ALT: 26 U/L (ref 0–35)
AST: 23 U/L (ref 0–37)
Albumin: 4.5 g/dL (ref 3.5–5.2)
Alkaline Phosphatase: 47 U/L (ref 39–117)
Bilirubin, Direct: 0.2 mg/dL (ref 0.0–0.3)
Total Bilirubin: 1.2 mg/dL (ref 0.2–1.2)
Total Protein: 7.2 g/dL (ref 6.0–8.3)

## 2024-06-15 LAB — CBC WITH DIFFERENTIAL/PLATELET
Basophils Absolute: 0.1 K/uL (ref 0.0–0.1)
Basophils Relative: 1.2 % (ref 0.0–3.0)
Eosinophils Absolute: 0 K/uL (ref 0.0–0.7)
Eosinophils Relative: 1.1 % (ref 0.0–5.0)
HCT: 42.3 % (ref 36.0–46.0)
Hemoglobin: 14 g/dL (ref 12.0–15.0)
Lymphocytes Relative: 36 % (ref 12.0–46.0)
Lymphs Abs: 1.6 K/uL (ref 0.7–4.0)
MCHC: 33.1 g/dL (ref 30.0–36.0)
MCV: 88.3 fl (ref 78.0–100.0)
Monocytes Absolute: 0.4 K/uL (ref 0.1–1.0)
Monocytes Relative: 8.8 % (ref 3.0–12.0)
Neutro Abs: 2.3 K/uL (ref 1.4–7.7)
Neutrophils Relative %: 52.9 % (ref 43.0–77.0)
Platelets: 203 K/uL (ref 150.0–400.0)
RBC: 4.79 Mil/uL (ref 3.87–5.11)
RDW: 13.7 % (ref 11.5–15.5)
WBC: 4.4 K/uL (ref 4.0–10.5)

## 2024-06-15 LAB — LIPASE: Lipase: 35 U/L (ref 11.0–59.0)

## 2024-06-15 LAB — AMYLASE: Amylase: 43 U/L (ref 27–131)

## 2024-06-15 MED ORDER — HYOSCYAMINE SULFATE 0.125 MG SL SUBL: 0.1250 mg | SUBLINGUAL_TABLET | SUBLINGUAL | 0 refills | Status: AC | PRN

## 2024-06-15 NOTE — Progress Notes (Signed)
 Kristina Greene 63 y.o. 03/19/1961 [id number]  Assessment & Plan:   Encounter Diagnoses  Name Primary?   Metabolic dysfunction-associated fatty liver disease (MAFLD) Yes   Xiphodynia    Hx of colonic polyps    Chronic bilateral upper abdominal pain    Cherry angioma      Fatty liver (hepatic steatosis) Previous imaging and tests showed no significant fibrosis w/ 2023 elastography kPA<5 . Current liver transaminases NL. - Order complete blood count, amylase, lipase, and platelet count, calculate FIB4 and determine if elastography needed again. I do not see a role for CT or MR  Chronic abdominal pain and xiphodynia Chronic abdominal pain with episodes of sharp pain, sometimes sore to touch. Pain managed well over the years, possibly related to muscle spasms or nerve irritation. - Prescribe hyoscyamine  for abdominal pain. - Advise against pressing on the xiphoid process. - Voltaren  gel topical to xiphoid area ? Of chronic pancreatitis per her report of CT in [place] Previous imaging showed normal pancreas on MRI in 2023 with no changes compared to previous CT scan.   Gastroesophageal reflux symptoms Intermittent symptoms managed with Tums and occasional Prilosec. Symptoms resolved after a couple of weeks. Dietary changes made to avoid triggers. - Continue Tums as needed. - Use Prilosec as needed.  Rectal pain associated with levator spasm - f/u urogynecology  History of colonic polyp 12 mm ssp 2017  Colonoscopy interval corrected to five years (2027 recall) due to history of polyps - I had incorrectly recommended 10 yr follow-up after 2022 negative exam  I have recommended she ask or see dermatology about the multiple cherry angiomas   Lab Results  Component Value Date   WBC 4.4 06/15/2024   HGB 14.0 06/15/2024   HCT 42.3 06/15/2024   MCV 88.3 06/15/2024   PLT 203.0 06/15/2024   Lab Results  Component Value Date   ALT 26 06/15/2024   AST 23 06/15/2024   ALKPHOS 47  06/15/2024   BILITOT 1.2 06/15/2024   Lab Results  Component Value Date   LIPASE 35.0 06/15/2024   Lab Results  Component Value Date   AMYLASE 43 06/15/2024     Fibrosis 4 Score = 1.4 (Indeterminate)       Interpretation for patients with NAFLD          <1.30       -  F0-F1 (Low risk)          1.30-2.67 -  Indeterminate           >2.67      -  F3-F4 (High risk)     Validated for ages 18-65       Will repeat US  and elastography    CC: Kristina Millman, DO    Subjective:   Chief Complaint: Metabolic associated fatty liver disease follow-up, abdominal pain  HPI Discussed the use of AI scribe software for clinical note transcription with the patient, who gave verbal consent to proceed.  Kristina Greene is a 63 year old female with metabolic associated fatty liver disease, history of thyroid cancer, pituitary macroadenoma sessile serrated colon polyp, and previous silent stroke who presents for follow-up of liver function tests and abdominal pain.  Husband is present for the visit.  She was last seen here in July 2023.  2023 studies:  MR abdomen with and without contrast IMPRESSION: 1. Hepatic steatosis.  No suspicious hepatic mass identified. 2. Gallbladder Phrygian cap which contains sludge. 3. Right renal cyst. 4. Mild colonic diverticulosis.  IMPRESSION: ULTRASOUND ABDOMEN:   Diffuse hepatic steatosis. Wedge-shaped hypoechoic area in the right lobe of the liver near the gallbladder fossa measuring 5.2 cm most likely reflects focal fatty sparing. If clinically indicated this could be more definitively characterized by hepatic protocol MRI with and without contrast.   ULTRASOUND HEPATIC ELASTOGRAPHY:   Median kPa: 2.8   Diagnostic category:  < or = 5 kPa: high probability of being normal     Electronically Signed   By: Reyes Holder M.D.   On: 02/03/2022 16:26     She reports her liver panels have been abnormal for the past two years, with reported  severely abnormal results last year leading to a two-week outpatient treatment in [place] per gastroenterologist there.she was given some sort of IV liver protector medication, milk thistle and other intravenous medications but she cannot name.  She was diagnosed with  Epstein-Barr virus infection, . Imaging studies in [place], including a CT scan and ultrasound, showed fatty liver (steatosis) and changes of chronic pancreatitis.  She still experiences severe abdominal pain, described as sharp and sore, occurring on both sides of the upper abdomen. The pain is sometimes triggered by touch and can last for a couple of hours, occurring twice a week to four or five times a month.  She has been experiencing acid reflux for the past two to three weeks, which she manages with Tums and occasionally Prilosec. She avoids spicy and fried foods, opting for steamed and sauted meals. Her last endoscopy was in 2016, which showed some gastritis.  She does have intermittent rectal pain and pelvic pain.  History of levator spasm previously saw Dr. Marilynne of urogynecology.  She reports poor sleep quality and has noticed an increase in red papules on her skin over the past two years.   She has a history of a pituitary tumor, which was surgically removed in 2018, with a small residual tissue near the cavernous sinus being monitored annually with MRI scans.      Colonoscopy 11/21/2020 normal  EGD 2016 in [place]  state gastritis-pathology with chronic inactive gastritis and no H. pylori, there was some intestinal metaplasia on antral biopsies and esophageal biopsies were normal Allergies  Allergen Reactions   Molds & Smuts Itching, Swelling and Other (See Comments)    Other reaction(s): Cough (ALLERGY/intolerance)-- pT STATES MOLDS  Rhinorrhea and itchy eyes Rhinorrhea and itchy eyes    Pollen Extract Other (See Comments)    Rhinorrhea and itchy eyes Rhinorrhea and itchy eyes Rhinorrhea and itchy  eyes Rhinorrhea and itchy eyes   Current Meds  Medication Sig   aspirin EC 81 MG tablet Take 81 mg by mouth daily. Swallow whole.   Cholecalciferol (VITAMIN D-3 PO) Take 1 capsule by mouth daily. 5000 IU   Coenzyme Q10 (COQ10) 100 MG CAPS Take 1 capsule by mouth daily.   ezetimibe (ZETIA) 10 MG tablet Take 1 tablet by mouth daily.   hyoscyamine  (LEVSIN  SL) 0.125 MG SL tablet Place 1 tablet (0.125 mg total) under the tongue every 4 (four) hours as needed.   levothyroxine (SYNTHROID) 100 MCG tablet Take 100 mcg by mouth daily before breakfast. Take 6 days a week Mon through Sat   Multiple Vitamin (MULTIVITAMIN) capsule Take 1 capsule by mouth daily.   omeprazole (PRILOSEC OTC) 20 MG tablet Take 20 mg by mouth. As needed   rosuvastatin (CRESTOR) 20 MG tablet Take 20 mg by mouth daily.   Past Medical History:  Diagnosis Date   Abdominal pain  Allergy    BMI 30.0-30.9,adult    Fatty liver    Gastritis    mild chronic   GERD (gastroesophageal reflux disease)    occasionally   Hepatic steatosis    without steatohepatitis   History of thyroid cancer    Hx of colonic polyps 08/11/2016   12 mm ssp 2017   Hyperlipidemia    Hypothyroidism    Impaired fasting glucose    Multiple allergies    NAFLD (nonalcoholic fatty liver disease)    Obesity    Palpitations    Pancreatitis    distant history   Pituitary macroadenoma (HCC)    Stroke (HCC)    SILENT TIA  STROKE BETWEEN 09-2018- 02-2020 PER MRI PER PT    Thyroid nodule    Vitamin D deficiency    Past Surgical History:  Procedure Laterality Date   APPENDECTOMY  1986   [place] bunionectomy Left 06/21/2013   BREAST BIOPSY  09/27/2012   Procedure: BREAST BIOPSY WITH NEEDLE LOCALIZATION;  Surgeon: Sherlean JINNY Laughter, MD;  Location: [facility];  Service: General;  Laterality: Right;   BREAST BIOPSY Right 05/08/2011   BREAST EXCISIONAL BIOPSY Right 09/27/2012   BUNIONECTOMY Right 09/13/2013   COLONOSCOPY      ESOPHAGOGASTRODUODENOSCOPY     HAMMER TOE SURGERY Left    2nd, 3rd, 4th, 5th   HAMMER TOE SURGERY Right 09/13/2013   METATARSAL OSTEOTOMY Left 06/21/2013   5th   NASAL SINUS SURGERY  12/22/2023   POLYPECTOMY     THYROIDECTOMY  2009   TRANSPHENOIDAL PITUITARY RESECTION  02/2017   UPPER GASTROINTESTINAL ENDOSCOPY     Social History   Social History Narrative   Originally from [place]   Married   + children   Has been employed at [employer]   No alcohol never smoker 3-4 caffeinated beverages daily   family history includes Colon cancer in her paternal uncle; Heart attack in her mother; Liver disease in her brother.   Review of Systems As per HPI  Objective:   Physical Exam @BP  100/66   Pulse 62   Ht 5' 4 (1.626 m)   Wt 166 lb 12.8 oz (75.7 kg)   LMP 01/04/2013 Comment: menopausal  BMI 28.63 kg/m @  General:  NAD Eyes:   anicteric Lungs:  clear Heart::  S1S2 no rubs, murmurs or gallops Abdomen:  Obese soft and diffusely tender to relatively light touch.  Pain is reduced with straight leg raise, and abdominal wall tension (negative Carnexiv)  She is markedly tender on the xiphoid process the sternum not so tender  Skin of the abdomen and trunk notable for multiple cherry angiomas no stigmata of chronic liver disease   Data Reviewed:  See HPI

## 2024-06-15 NOTE — Patient Instructions (Addendum)
  VISIT SUMMARY: Today, you came in for a follow-up on your liver function tests and abdominal pain. We reviewed your history of fatty liver, reported chronic pancreatitis ([place] CT scan), and recent symptoms including acid reflux and poor sleep quality. We also discussed your history of Epstein-Barr virus infection, pituitary tumor, and colonic polyp removal.  YOUR PLAN: -FATTY LIVER (HEPATIC STEATOSIS): Fatty liver disease means there is excess fat in your liver. Your current liver tests are mostly normal, with a slight rise in direct bilirubin that is not concerning. We will order a complete blood count, amylase, lipase, and platelet count, and repeat liver function tests.  -CHRONIC ABDOMINAL PAIN AND XIPHODYNIA: Chronic abdominal pain can be due to various reasons including muscle spasms or nerve irritation. We will prescribe hyoscyamine  to help manage your abdominal pain. Please avoid pressing on the xiphoid process, which is the lower part of your sternum. You can purchase Voltaren  Gel over the counter and apply to the area of pain at the bottom of the breast bone.  -GASTROESOPHAGEAL REFLUX SYMPTOMS: Gastroesophageal reflux disease (GERD) occurs when stomach acid frequently flows back into the tube connecting your mouth and stomach. Your symptoms are managed with Tums and occasional Prilosec. Continue using Tums as needed and Prilosec if necessary.  -RECTAL PAIN ASSOCIATED WITH LEVATOR SPASM: Rectal pain can be caused by muscle spasms. This has been previously evaluated by a urogynecologist.  -HISTORY OF COLONIC POLYP: A colonic polyp is a small clump of cells that forms on the lining of the colon. You had a polyp removed in 2022. We will schedule your next colonoscopy in 2027.   I appreciate the opportunity to care for you. Lupita Commander, MD, [facility]                                                  Contains text generated by [employer].

## 2024-06-16 ENCOUNTER — Other Ambulatory Visit: Payer: Self-pay

## 2024-06-16 DIAGNOSIS — K760 Fatty (change of) liver, not elsewhere classified: Secondary | ICD-10-CM

## 2024-06-16 NOTE — Progress Notes (Signed)
 Mail box is full will attempt later  My Chart message has been sent

## 2024-06-23 ENCOUNTER — Ambulatory Visit (HOSPITAL_COMMUNITY)
Admission: RE | Admit: 2024-06-23 | Discharge: 2024-06-23 | Disposition: A | Discharge status: Home / Self Care | Source: Ambulatory Visit | Attending: Internal Medicine | Admitting: Internal Medicine

## 2024-06-23 ENCOUNTER — Other Ambulatory Visit: Payer: Self-pay | Admitting: Internal Medicine

## 2024-06-23 ENCOUNTER — Encounter: Payer: Self-pay | Admitting: Internal Medicine

## 2024-06-23 DIAGNOSIS — K760 Fatty (change of) liver, not elsewhere classified: Secondary | ICD-10-CM | POA: Diagnosis present

## 2024-06-29 NOTE — Progress Notes (Addendum)
 SUBJECTIVE:   Chief Complaint: Congestion  HPI: Kristina Greene is a 63 y.o. female who presents today for follow-up of chronic sinusitis.   She has a history of pituitary adenoma, papillary thyroid cancer, seasonal allergies, persistent post nasal drainage, and throat discomfort. She has a history nonfunctional adenoma resection 02/18/2017 at [facility].  No evidence of AI or other hypopituitarism, including on pre-op Cosyntropin stim test at [facility] and post-op random cortisol when off HC at [facility].  She had pre-op primary hypothyroidism. Following TSH and Free T4 given subsequent pituitary surgery.   Her last MRI from 09/2018 at [facility] was stable w/o clear evidence of recurrent disease. There was small area in R cavernous sinus they planned to follow in 09/2019  Patient presents today with sinusitis complaints. Notes post nasal drip. Has been on antibiotics twice in the past year. Was recently on doxycyline x 3 weeks with steroid taper prescribed by Dr. Alex at the end of May. Had MRI in 03/12/20 which showed mild sinus inflammation and stable pituitary findings. Also saw Dr. Alex at that time. Denies vision changes outside of normal. Also notes cough in the middle of the night. Mucus in throat that needs to be cleared. Hoarse voice in the morning that improves with time.  HIstory of HLD, papillary thyroid carcinoma s/p total thyroidectomy (no RAI).  Denies tobacco, denies regular alcohol.  Update 05/03/2020: Fu CRS and CR, also history of nonfunctional pituiatary adenoma s/p resection 02/2017. Last visit, she was noted to have significant scarring in her nasal cavity R>L. We also discussed continuing her saline irrigations. She is scheduled for same day CT imaging.   She notes right-sided facial pressure along her maxillary and frontal distributions. Denies hyposmia. Doing saline irrigations, but having PND with the irrigations. She also uses Omeprazole for her acid reflux. She also endorses frequent headaches.  The patient would like to find the cause of her previous stroke.   Pain/Pressure: Right maxillary/frontal pressure Sense of smell: Not impacted Drainage: PND Nasal Congestion: Mild, intermittent R>L    Update 08/05/21: Fu CRS and CR, also history of nonfunctional pituiatary adenoma s/p resection 02/2017. Last seen over 1 year ago, plan at that time was for revision surgery pending clearance for her recent TIA. She is still getting worked up for the cause of her TIA, found to have Afib and a PFO. She does note right sided facial pain/pressure and right sided nasal congestion. She denies issues with drainage, smell disturbances, blurry or double vision. She rinses and uses nasal sprays intermittently for ~ 1 week at a time when symptomatic. I have personally reviewed her CT obtained from [facility] and I note irregular mucosa on the floor off the right side and mucosal thickening in the left maxillary.       Update 10/28/2021:  FU CRS, CR. Hx nonfunctional pituitary adenoma s/p resection 02/2017 at [facility]. She endorse facial pain/pressure. She continues to rinse with DDM with improvement in nasal obstruction but not facial pain/pressure. Good sense of smell. Sore throat from PND. She has been able to tell some of a difference with adding DDM to rinses in regards to congestion and drainage. Her primary complaint is facial pain today.   Update 02/27/2022:  FU CRS, CR. Hx nonfunctional pituitary adenoma s/p resection 02/2017 at [facility]. Continue DDM rinses. Was diagnosed with strep throat 3 days ago and started on augmentin. Tested negative for COVID with several at-home tests. Throat and referred ear pain symptoms improving on the abx. Has facial  pain, but denies obstruction, drainage, hyposmia.   Doing very well w/ DDM, feels it has helped a lot.   Update 11/06/2022: FU CRS, CR. Hx nonfunctional pituitary adenoma s/p resection 02/2017 at [facility]. Has discontinued DDM rinses and finish Augmentin course. Required a course  of antibiotics in the summer. Has had some congestion particiualry with stopping DDM.  Some intermittent face pain and a feeling of swelling on the left. None recent.  Had a recent MRI fu and I have reviewed with findings of 1.6 by 0.6 by 1.1 cm residual disease. This is being observed by her surgeon at [facility], however, has also been offered xrt however, she is holding off.   Update 10/12/2023; FU Chronic rhinitis, History of pitiutary adenoma sp resection with residual disease, Chronic rhinosinusitis, Previous stroke, Recent Strep throat , and Pus in the sphenoid sinus. At the previous visit,  Recommended she continue with DDM sinus rinse.  Today, she presents for follow-up. She reports intermittent bilateral maxillary pain and pressure since she was last seen. She felt this was improved when she was performing DDM rinses. She has not been doing these recently because [place] was [place]. She reports some bilateral clear rhinorrhea and decreased sense in smell. Denies nasal obstruction. Reports occasional dizziness. Pain/Pressure: Yes Sense of smell: Decreased Drainage:Yes, clear Nasal Congestion: No  Treatments: None  I personally reviewed the CT scan obtained today and I found Postsurgical sequela of right maxillary antrostomy, total ethmoidectomy and sphenoidotomy. Possible right frontal sinusotomy. Partial right middle and inferior turbinectomies. Minimal mucosal thickening of the right maxillary sinus and wall of the maxillary antrostomy. Polypoid mucosal thickening of the left maxillary sinus. Mucous plugging of the left maxillary ostium. Scattered mild mucosal thickening of the ethmoid air cells. Coalescence of the bilateral sphenoid sinuses with dehiscent anterior wall and absence of the inter sphenoid septum, likely postsurgical. Mild mucosal thickening along the posterior wall of the sphenoid sinuses, similar to prior. Similar right greater than left mucosal thickening along the floors of  the nasal cavities.     Update 12/17/2023: FU Chronic rhinitis, History of pitiutary adenoma sp resection with residual disease, Chronic rhinosinusitis, Previous stroke, Recent Strep throat , and Pus in the sphenoid sinus. At the previous visit, Recommended for surgery and continue DDM sinus rinses BID.  Today, she presents for pre-op visit with her husband. She is scheduled for endoscopic sinus surgery on 12/22/2023. Has been doing well overall. She inquires various questions about surgery.  Update 12/28/2023: FU Chronic rhinitis, History of pitiutary adenoma sp resection with residual disease, Chronic rhinosinusitis, Previous stroke, Recent Strep throat , and Pus in the sphenoid sinus. At the previous visit, Recommended for surgery and continue with DDM sinus rinse. She underwent revision endoscopic sinus surgery on 12/22/2023. Procedure(s) Performed:  1.  Bilateral nasal endoscopy total ethmoidectomy, including sphenoidotomy  (CPT 31257-50)  2. Bilateral nasal endoscopy with frontal sinusotomy, (CPT [id number]).   3. Bilateral maxillary endoscopy  (CPT [id number]). 4. Bilateral outfracture of inferior turbinates (CPT 30930-50)  4. Stereotactic Computer assisted navigation, extradural (CPT [phone])    Operative Findings:  Evidence of a prior endonasal approach for resection of a pituitary mass 2. The right inferior turbinate remnant is medialized and pushed towards the maxillary sinus, scared with the middle turbinate 3. Left middle turbinate is lateralized, obstructing the middle meatus 4. Left Onodi cell - removed 5. Bilateral inferior turbinates hypertrophy - causing nasal obstruction.  Today, she presents for follow-up, has been doing well overall.  Reports nausea and vomiting. She does sinus rinses daily and takes doxycyline. Had dry cough 2nd day post-operatively.. Reports chest tightness.   Update 01/18/2024: FU Chronic rhinitis, History of pitiutary adenoma sp resection with residual  disease, Chronic rhinosinusitis, Previous stroke, Recent Strep throat, and Pus in the sphenoid sinus. At the previous visit,Recommended to continue with DDM irrigations and doxycyline.  Today, she presents for follow-up. She reports she is doing better since her last visit. She reports some pressure but denies epistaxis. She reports some blurry vision since surgery but denies double vision.   Pain/Pressure: Yes Sense of smell: Intact Drainage: No Nasal Congestion: No  Treatments: BID DDM irrigations with third saline irrigation in the middle of the day  Update 03/10/2024: FU Chronic rhinitis, History of pitiutary adenoma sp resection with residual disease, Chronic rhinosinusitis, Previous stroke, Recent Strep throat, and Pus in the sphenoid sinus. At the previous visit, Recommended to complete course of antibiotic and to do saline sinus rinse on flight and to use Flonase and DDM sinus rinse BID during allergy season. Today, she presents to the office, has been doing well overall. Reports nasal pressure. Denies crusting from his nose. Reports minimal nasal drainage and sore throat. She does DDM sinus rinse once daily. She is not taking any other nasal spray or oral medication.  Pain/Pressure: yes Sense of smell: Intact Drainage: Yes Nasal Congestion:  No  Update 06/09/2024: FU Chronic rhinitis, History of pitiutary adenoma sp resection with residual disease, Chronic rhinosinusitis, Previous stroke, Recent Strep throat , and Pus in the sphenoid sinus. At last visit, recommended to continue with saline and DDM sinus rinse and salt water spray.  Today, she presents for follow-up,Has been doing well overall. States she has a sinus infection with she was in [place] and was seen by an ENT  but did not notice anuy sinus infection. States she has tiny area of fullness on her tongue and her provider took screen shot of voice box while doing endoscopy. She had right ear pain and facial pressure. No  dysphagia, odynophagia, or weight loss.   She underwent thyroid removal in 2009.  Pain/Pressure: No Sense of smell: Stable Drainage: Yes Nasal Congestion: No  Update 06/30/2024: FU Chronic rhinitis, History of pitiutary adenoma sp resection with residual disease, Chronic rhinosinusitis, Previous stroke , Recent Strep throat , Pus in the sphenoid sinus, and ?Right BOT mass. At last visit, recommend continue with DDM sinus rinse BID.  Today, she presents  to the office for follow-up, reports doing about the same as the last visit. Thinks she is having some flare ups from allergies. She endorses some right ear discomfort that has been stable, but no changes in hearing. She denies issues with swallowing or pain with swallowing. No unintentional weight loss. No masses in the neck that she has noticed. Pain/Pressure: no Sense of smell: stable Drainage: yes Nasal Congestion: No  I personally reviewed the CT scan neck obtained on 06/17/2024  and I concur with the findings of very limited evaluation of the oropharynx and oral cavity due to dental hardware streak artifact. Within this limitation there is questionable asymmetric enhancement of the right lateral aspect of the base of tongue and palatine tonsil. Recommend correlation with direct visualization. No lymphadenopathy. Surgical changes of transsphenoidal approach for pituitary mass resection with enhancing residual soft tissue.  Postsurgical changes of thyroidectomy with no evidence of recurrent disease.  Past Medical History She  has a past medical history of Abnormal ECG (02/2017), Allergic rhinitis (2018),  Breast cyst, Cancer    (CMS-HCC) (Thyroid 2009), CVA (cerebral vascular accident)    (CMS-HCC), Dizziness (in and off since 2009), Fatty liver (02/12/2017), Glucose intolerance, Headache, Hyperlipidemia, Hypertension (07/25/2020), Hypothyroidism (2009), Infectious viral hepatitis (1969), Low vitamin D level, Nonalcoholic steatohepatitis  (NASH), PONV (postoperative nausea and vomiting) (2014), Sinusitis (2018), Strep throat, Thyroid nodule, Transient ischemic attack (04/12/2020), and Venous insufficiency.  Past Surgical History Her  has a past surgical history that includes Breast cyst aspiration; Breast biopsy (Right); Colonoscopy (11/21/2020); Sinus surgery (May 2018); Brain surgery (May 2018); Thyroid surgery (2009); Nose surgery (Right maxillary antrostomy, total ethmoidectomy and sphenoidotomy); Other surgical history (1986, 2014, 2014); pr nasal/sinus ndsc total with sphenoidotomy (Bilateral, 12/22/2023); pr nasal/sinus endoscopy,open maxill sinus (Bilateral, 12/22/2023); pr nasal/sinus ndsc w/rmvl tiss from frontal sinus (Bilateral, 12/22/2023); pr stereotactic comp assist proc,cranial,extradural (N/A, 12/22/2023); and pr theraputic fracture infer turbinate (Bilateral, 12/22/2023).  Past Family History Her family history includes Arthritis in her mother; Asthma in her sister and sister; COPD in her sister and sister; Cataracts in her mother; Hypertension in her mother; Liver disease in her brother and brother; No Known Problems in her daughter, father, maternal grandfather, maternal grandmother, paternal grandfather, paternal grandmother, and another family member.  Past Social History She  reports that she has never smoked. She has never used smokeless tobacco. She reports that she does not drink alcohol and does not use drugs.  Medications/Allergies/Immunizations Her current medication(s) include:   Current Outpatient Medications:  .  aspirin (ECOTRIN) 81 MG tablet, Take 1 tablet (81 mg total) by mouth daily., Disp: , Rfl:  .  baclofen (LIORESAL) 10 MG tablet, Take 1 tablet (10 mg total) by mouth two (2) times a day as needed. (Patient not taking: Reported on 02/08/2024), Disp: , Rfl:  .  CHOLECALCIFEROL, VITAMIN D3, (CHOLECALCIFEROL, VIT D3,,BULK,) 100,000 unit/gram Powd, Take 5,000 mg by mouth. , Disp: , Rfl:  .  co-enzyme Q-10 30  mg capsule, Take 200 mg by mouth daily., Disp: , Rfl:  .  doxycycline (ADOXA) 100 MG tablet, Take 1 tablet (100 mg total) by mouth two (2) times a day., Disp: 60 tablet, Rfl: 0 .  ezetimibe (ZETIA) 10 mg tablet, Take 1 tablet (10 mg total) by mouth daily., Disp: 90 tablet, Rfl: 3 .  levothyroxine (SYNTHROID) 50 MCG tablet, Take 2 tabs daily on Monday-Saturday; no tablets on Sunday, Disp: 180 tablet, Rfl: 3 .  meloxicam  (MOBIC ) 15 MG tablet, Take 1 tablet (15 mg total) by mouth daily., Disp: , Rfl:  .  mometasone furoate, bulk, 100 % Powd, 1.2mg /mL; add 10mL to 240mL saline; irrigate with 120mL solution BID, Disp: 1 g, Rfl: 11 .  multivitamin (TAB-A-VITE/THERAGRAN) per tablet, Take 1 tablet by mouth daily., Disp: , Rfl:  .  omeprazole (PRILOSEC) 20 MG capsule, Take 1 capsule (20 mg total) by mouth daily. PRN, Disp: , Rfl:  .  rosuvastatin (CRESTOR) 20 MG tablet, Take 1 tablet (20 mg total) by mouth daily., Disp: , Rfl:  .  sod chlor,sod bicarb/neti pot (NEILMED NASAFLO NASL), into each nostril., Disp: , Rfl:  .  trimethoprim (TRIMPEX) 100 mg tablet, 1 tablet (100 mg total). Start back after taking antibiotic., Disp: , Rfl:  Allergies: Mold, Other, and Pollen extracts, Immunizations:  Immunization History  Administered Date(s) Administered  . COVID-19 VACCINE,MRNA(MODERNA)(PF) 11/16/2019, 12/14/2019, 10/05/2020   Review of Systems:   Her RSDI score was previously 9, 23 and is documented in the chart. NOSE - previously 13 SNOT-22 today is 29 obtained,  was previously 23, 56, 37, 44.   PHYSICAL EXAM GENERAL APPEARANCE:  Well developed, well nourished.   HEAD AND FACE: No lesions or masses.   ENT  Nose:  Visualization of the middle meatus was limited on anterior evaluation. External inspection of nose reveals no lesions, no masses.  Oral cavity/Oropharynx: Lips and gums are normal.  Oropharynx, including the mucosa of oral cavity, hard and soft palates, tongue, and posterior pharyngeal wall  showed normal symmetry without lesion and normal hydration of mucosal surfaces.     Procedure: Diagnostic Nasalpharyngolaryngoscopy exam Surgeon: B. Senior, MD. Findings: Posterior pharyngeal wall is normal. Some fullness on the right base of tongue but no discrete masses or lesion. Vocal fold motion is normal. Subglottis is normal. Pyriform sinuses are normal   Diagnostic Bilateral Flexible Fiberoptic Nasal Endoscopy   Surgeon:  B. Senior, MD Anesthesia:  1% Lidocaine  Procedure Detail:  As a result of inability to visualize the intranasal anatomy, and after achieving adequate topical anesthesia and understanding the potential risks related to the procedure (primarily bleeding), a flexible fiberoptic endoscope is used to examine the left and right sinonasal cavities, including the interior of the nasal cavity, septum, and the middle and superior meatus, the inferior, midde, and superior turbinates, and the spheno-ethmoid recess. All these areas were inspected.  Findings:  Left: IT is decongested. Septum is midline. MS is clear. Sphenoid is clear. ES heading up into the frontal is healthy. Mild edema in the MM.  Right: IT is previously removed. Septum is midline. MS and sphenoid is healthy. ES heading up into the frontal is healthy. No mucus, pus, polyp, or crust is noted.  Larrie Berber Nasal Endoscopy Score: [employer] is used to assess the degree of inflammation of the sinonasal structures, including the middle and superior turbinates, the ethmoid sinuses, maxillary sinuses, frontal sinuses, and sphenoid sinuses.  In the presence of previous surgery, some or all of these structures may be absent. Left       Polyps:  Absent (0)  Edema:   Mild (1)  Discharge:  None (0)   Scarring:  Absent (0)  Crusting:  None (0)    Total Left:  1    Right Polyps:  Absent (0)  Edema:  Absent (0)  Discharge: None (0)   Scarring:  Absent (0)  Crusting:  None (0)    Total Right:   0    ASSESSMENT Chronic rhinitis History of pitiutary adenoma sp resection with residual disease.  Chronic rhinosinusitis Previous stroke  Recent Strep throat  Pus in the sphenoid sinus ?Right BOT mass  PLAN: 1.  She is doing well with CRS s/p revision endoscopic sinus surgery on 12/22/2023.   Unfortunately, the imaging we obtained today for her base of tongue was somewhat optimal as a result of some dental airfact. I do perceive on the NPL exam, some persistent fullness in this area on the right side, however, I have suggested that she see one of my head and neck colleagues for consideration of possible biopsy.   In the meantime, she will continue with her DDM rinses and I've recommended that if she does have increase in symptoms that she may want to increase this twice a day rather than once a day. I would suggest this is a better option than just simply adding in fluticasone nasal spray. Though she may do this as an alternative as well.  The potential side effects of topical steroid use are extremely rare but  include and are not limited to: increased appetite, insomnia, fluid retention, mood swings, weight gain, change in blood pressure, high blood glucose, possible adrenal suppression, osteoporosis, menstrual irregularities (if applicable), and cataracts. In rare cases need for hip replacement can occur.  Please notify physician's office of any concerns.   Follow up in 3-4 months or sooner for any concern.   I was present and participated in the entire office visit, examination including the entire endoscopy procedure (if performed).  I briefly reviewed with the patient that based on a new federal law, the patient will be provided with their laboratory, pathology and radiology results once they are finalized. Meaning, They will likely receive these results before the I have had a chance to review them independently. I reassured them that we will work hard to respond back and counsel them on  the results, though I asked that they allow for 5 business days for me to review the results and call them. If they have not heard within this time period, they have our contact information and know to call or message us  through MyChart.  In addition, my notes will also be available to them and may contain information that has not been thoroughly edited for them.   Scribe attestation: Thresa LABOR. Senior, MD obtained and performed the history, physical exam and medical decision making elements that were entered into the chart. Signed by Felisa Loosen, Medical Scribe, on June 30, 2024 at 9:14 AM.

## 2024-07-06 ENCOUNTER — Ambulatory Visit: Payer: Self-pay | Admitting: Internal Medicine
